# Patient Record
Sex: Male | Born: 1945 | Race: Black or African American | Hispanic: No | Marital: Married | Smoking: Never smoker
Health system: Southern US, Community
[De-identification: ages and names within clinical notes are randomized; demographics above are authoritative.]

## PROBLEM LIST (undated history)

## (undated) DIAGNOSIS — I10 Essential (primary) hypertension: Secondary | ICD-10-CM

## (undated) DIAGNOSIS — M109 Gout, unspecified: Secondary | ICD-10-CM

## (undated) HISTORY — PX: FOOT SURGERY: SHX648

## (undated) HISTORY — PX: KNEE SURGERY: SHX244

## (undated) HISTORY — DX: Essential (primary) hypertension: I10

---

## 1997-08-05 HISTORY — PX: BACK SURGERY: SHX140

## 1999-02-21 ENCOUNTER — Encounter: Payer: Self-pay | Admitting: Anesthesiology

## 1999-02-22 ENCOUNTER — Encounter: Payer: Self-pay | Admitting: Neurosurgery

## 1999-02-22 ENCOUNTER — Observation Stay (HOSPITAL_COMMUNITY): Admission: RE | Admit: 1999-02-22 | Discharge: 1999-02-23 | Payer: Self-pay | Admitting: Neurosurgery

## 1999-02-23 ENCOUNTER — Encounter: Payer: Self-pay | Admitting: Hematology and Oncology

## 1999-04-21 ENCOUNTER — Ambulatory Visit (HOSPITAL_COMMUNITY): Admission: RE | Admit: 1999-04-21 | Discharge: 1999-04-21 | Payer: Self-pay | Admitting: Neurosurgery

## 1999-04-21 ENCOUNTER — Encounter: Payer: Self-pay | Admitting: Neurosurgery

## 1999-05-08 ENCOUNTER — Ambulatory Visit (HOSPITAL_COMMUNITY): Admission: RE | Admit: 1999-05-08 | Discharge: 1999-05-08 | Payer: Self-pay | Admitting: Neurosurgery

## 1999-05-08 ENCOUNTER — Encounter: Payer: Self-pay | Admitting: Neurosurgery

## 1999-05-22 ENCOUNTER — Ambulatory Visit (HOSPITAL_COMMUNITY): Admission: RE | Admit: 1999-05-22 | Discharge: 1999-05-22 | Payer: Self-pay | Admitting: Neurosurgery

## 1999-05-22 ENCOUNTER — Encounter: Payer: Self-pay | Admitting: Neurosurgery

## 1999-06-13 ENCOUNTER — Encounter: Payer: Self-pay | Admitting: Neurosurgery

## 1999-06-13 ENCOUNTER — Ambulatory Visit (HOSPITAL_COMMUNITY): Admission: RE | Admit: 1999-06-13 | Discharge: 1999-06-13 | Payer: Self-pay | Admitting: Neurosurgery

## 2000-01-08 ENCOUNTER — Encounter: Payer: Self-pay | Admitting: Neurosurgery

## 2000-01-08 ENCOUNTER — Ambulatory Visit (HOSPITAL_COMMUNITY): Admission: RE | Admit: 2000-01-08 | Discharge: 2000-01-08 | Payer: Self-pay | Admitting: Neurosurgery

## 2000-01-22 ENCOUNTER — Encounter: Payer: Self-pay | Admitting: Neurosurgery

## 2000-01-22 ENCOUNTER — Ambulatory Visit: Admission: RE | Admit: 2000-01-22 | Discharge: 2000-01-22 | Payer: Self-pay | Admitting: Neurosurgery

## 2000-02-05 ENCOUNTER — Encounter: Payer: Self-pay | Admitting: Neurosurgery

## 2000-02-05 ENCOUNTER — Ambulatory Visit (HOSPITAL_COMMUNITY): Admission: RE | Admit: 2000-02-05 | Discharge: 2000-02-05 | Payer: Self-pay | Admitting: Neurosurgery

## 2000-06-28 ENCOUNTER — Encounter: Payer: Self-pay | Admitting: Neurosurgery

## 2000-06-28 ENCOUNTER — Ambulatory Visit (HOSPITAL_COMMUNITY): Admission: RE | Admit: 2000-06-28 | Discharge: 2000-06-28 | Payer: Self-pay | Admitting: Neurosurgery

## 2000-07-09 ENCOUNTER — Ambulatory Visit (HOSPITAL_COMMUNITY): Admission: RE | Admit: 2000-07-09 | Discharge: 2000-07-09 | Payer: Self-pay | Admitting: Neurosurgery

## 2000-07-09 ENCOUNTER — Encounter: Payer: Self-pay | Admitting: Neurosurgery

## 2000-07-23 ENCOUNTER — Encounter: Payer: Self-pay | Admitting: Neurosurgery

## 2000-07-23 ENCOUNTER — Ambulatory Visit (HOSPITAL_COMMUNITY): Admission: RE | Admit: 2000-07-23 | Discharge: 2000-07-23 | Payer: Self-pay | Admitting: Neurosurgery

## 2000-08-20 ENCOUNTER — Ambulatory Visit (HOSPITAL_COMMUNITY): Admission: RE | Admit: 2000-08-20 | Discharge: 2000-08-20 | Payer: Self-pay | Admitting: Neurosurgery

## 2000-08-20 ENCOUNTER — Encounter: Payer: Self-pay | Admitting: Neurosurgery

## 2001-01-02 ENCOUNTER — Encounter: Payer: Self-pay | Admitting: Neurosurgery

## 2001-01-02 ENCOUNTER — Encounter: Admission: RE | Admit: 2001-01-02 | Discharge: 2001-01-02 | Payer: Self-pay | Admitting: Neurosurgery

## 2001-12-22 ENCOUNTER — Encounter: Payer: Self-pay | Admitting: *Deleted

## 2001-12-22 ENCOUNTER — Emergency Department (HOSPITAL_COMMUNITY): Admission: EM | Admit: 2001-12-22 | Discharge: 2001-12-22 | Payer: Self-pay | Admitting: *Deleted

## 2002-09-01 ENCOUNTER — Ambulatory Visit (HOSPITAL_COMMUNITY): Admission: RE | Admit: 2002-09-01 | Discharge: 2002-09-01 | Payer: Self-pay | Admitting: Neurosurgery

## 2002-09-01 ENCOUNTER — Encounter: Payer: Self-pay | Admitting: Neurosurgery

## 2002-09-16 ENCOUNTER — Encounter: Admission: RE | Admit: 2002-09-16 | Discharge: 2002-09-16 | Payer: Self-pay | Admitting: Neurosurgery

## 2002-09-16 ENCOUNTER — Encounter: Payer: Self-pay | Admitting: Neurosurgery

## 2002-09-29 ENCOUNTER — Encounter: Admission: RE | Admit: 2002-09-29 | Discharge: 2002-09-29 | Payer: Self-pay | Admitting: Neurosurgery

## 2002-09-29 ENCOUNTER — Encounter: Payer: Self-pay | Admitting: Neurosurgery

## 2004-02-15 ENCOUNTER — Encounter: Payer: Self-pay | Admitting: Orthopedic Surgery

## 2004-07-18 ENCOUNTER — Ambulatory Visit: Payer: Self-pay | Admitting: Urgent Care

## 2004-07-19 ENCOUNTER — Ambulatory Visit (HOSPITAL_COMMUNITY): Admission: RE | Admit: 2004-07-19 | Discharge: 2004-07-19 | Payer: Self-pay | Admitting: Internal Medicine

## 2004-07-19 ENCOUNTER — Ambulatory Visit: Payer: Self-pay | Admitting: Internal Medicine

## 2005-10-23 ENCOUNTER — Ambulatory Visit: Payer: Self-pay | Admitting: Orthopedic Surgery

## 2006-01-07 ENCOUNTER — Ambulatory Visit (HOSPITAL_COMMUNITY): Admission: RE | Admit: 2006-01-07 | Discharge: 2006-01-07 | Payer: Self-pay | Admitting: Family Medicine

## 2006-01-15 ENCOUNTER — Ambulatory Visit: Payer: Self-pay | Admitting: Orthopedic Surgery

## 2006-04-24 ENCOUNTER — Ambulatory Visit: Payer: Self-pay | Admitting: Orthopedic Surgery

## 2006-05-02 ENCOUNTER — Ambulatory Visit (HOSPITAL_COMMUNITY): Admission: RE | Admit: 2006-05-02 | Discharge: 2006-05-02 | Payer: Self-pay | Admitting: Urology

## 2006-05-02 ENCOUNTER — Encounter (INDEPENDENT_AMBULATORY_CARE_PROVIDER_SITE_OTHER): Payer: Self-pay | Admitting: Specialist

## 2006-06-24 ENCOUNTER — Ambulatory Visit (HOSPITAL_COMMUNITY): Admission: RE | Admit: 2006-06-24 | Discharge: 2006-06-24 | Payer: Self-pay | Admitting: Urology

## 2006-11-13 ENCOUNTER — Emergency Department (HOSPITAL_COMMUNITY): Admission: EM | Admit: 2006-11-13 | Discharge: 2006-11-13 | Payer: Self-pay | Admitting: Emergency Medicine

## 2006-12-03 ENCOUNTER — Ambulatory Visit: Payer: Self-pay | Admitting: Orthopedic Surgery

## 2007-02-25 ENCOUNTER — Ambulatory Visit: Payer: Self-pay | Admitting: Orthopedic Surgery

## 2007-05-21 ENCOUNTER — Ambulatory Visit: Payer: Self-pay | Admitting: Internal Medicine

## 2007-05-21 ENCOUNTER — Ambulatory Visit: Payer: Self-pay | Admitting: Orthopedic Surgery

## 2007-05-21 DIAGNOSIS — M1710 Unilateral primary osteoarthritis, unspecified knee: Secondary | ICD-10-CM

## 2007-06-17 ENCOUNTER — Ambulatory Visit (HOSPITAL_COMMUNITY): Admission: RE | Admit: 2007-06-17 | Discharge: 2007-06-17 | Payer: Self-pay | Admitting: Internal Medicine

## 2007-06-17 ENCOUNTER — Ambulatory Visit: Payer: Self-pay | Admitting: Internal Medicine

## 2007-06-22 ENCOUNTER — Ambulatory Visit (HOSPITAL_COMMUNITY): Admission: RE | Admit: 2007-06-22 | Discharge: 2007-06-22 | Payer: Self-pay | Admitting: Internal Medicine

## 2007-06-22 ENCOUNTER — Ambulatory Visit: Payer: Self-pay | Admitting: Gastroenterology

## 2007-07-03 ENCOUNTER — Ambulatory Visit (HOSPITAL_COMMUNITY): Admission: RE | Admit: 2007-07-03 | Discharge: 2007-07-03 | Payer: Self-pay | Admitting: Internal Medicine

## 2007-08-06 HISTORY — PX: COLON SURGERY: SHX602

## 2008-05-04 ENCOUNTER — Ambulatory Visit: Payer: Self-pay | Admitting: Orthopedic Surgery

## 2008-05-04 DIAGNOSIS — Z8679 Personal history of other diseases of the circulatory system: Secondary | ICD-10-CM | POA: Insufficient documentation

## 2009-01-11 ENCOUNTER — Encounter: Payer: Self-pay | Admitting: Internal Medicine

## 2009-04-05 ENCOUNTER — Ambulatory Visit: Payer: Self-pay | Admitting: Orthopedic Surgery

## 2009-05-03 ENCOUNTER — Ambulatory Visit: Payer: Self-pay | Admitting: Orthopedic Surgery

## 2009-05-26 ENCOUNTER — Encounter (INDEPENDENT_AMBULATORY_CARE_PROVIDER_SITE_OTHER): Payer: Self-pay | Admitting: *Deleted

## 2009-05-30 ENCOUNTER — Ambulatory Visit: Payer: Self-pay | Admitting: Orthopedic Surgery

## 2009-05-30 ENCOUNTER — Inpatient Hospital Stay (HOSPITAL_COMMUNITY): Admission: RE | Admit: 2009-05-30 | Discharge: 2009-06-03 | Payer: Self-pay | Admitting: Orthopedic Surgery

## 2009-06-01 ENCOUNTER — Encounter (INDEPENDENT_AMBULATORY_CARE_PROVIDER_SITE_OTHER): Payer: Self-pay | Admitting: *Deleted

## 2009-06-01 ENCOUNTER — Encounter: Payer: Self-pay | Admitting: Orthopedic Surgery

## 2009-06-04 ENCOUNTER — Encounter: Payer: Self-pay | Admitting: Orthopedic Surgery

## 2009-06-06 ENCOUNTER — Encounter: Payer: Self-pay | Admitting: Orthopedic Surgery

## 2009-06-08 ENCOUNTER — Encounter: Payer: Self-pay | Admitting: Orthopedic Surgery

## 2009-06-13 ENCOUNTER — Ambulatory Visit: Payer: Self-pay | Admitting: Orthopedic Surgery

## 2009-06-28 ENCOUNTER — Encounter (INDEPENDENT_AMBULATORY_CARE_PROVIDER_SITE_OTHER): Payer: Self-pay | Admitting: *Deleted

## 2009-06-28 ENCOUNTER — Encounter: Payer: Self-pay | Admitting: Orthopedic Surgery

## 2009-07-04 ENCOUNTER — Encounter: Payer: Self-pay | Admitting: Orthopedic Surgery

## 2009-07-05 ENCOUNTER — Encounter (HOSPITAL_COMMUNITY): Admission: RE | Admit: 2009-07-05 | Discharge: 2009-08-04 | Payer: Self-pay | Admitting: Orthopedic Surgery

## 2009-07-05 ENCOUNTER — Encounter: Payer: Self-pay | Admitting: Orthopedic Surgery

## 2009-07-12 ENCOUNTER — Ambulatory Visit: Payer: Self-pay | Admitting: Orthopedic Surgery

## 2009-07-15 IMAGING — RF DG SMALL BOWEL
15 of 24 series · 15 of 24 positions shown · non-contrast
Comparison: none

CLINICAL DATA: Multiple polyps identified with a capsule study. That report is available.  No prior imaging of the small bowel is available for review.
 SMALL BOWEL STUDY ? 07/03/07:
TECHNIQUE: Numerous images of the small bowel are performed throughout the examination with compression in an attempt to evaluate for polyps.

[Series 1: run · 1 of 10 slices shown (1 of 15)]
[im 1/10]
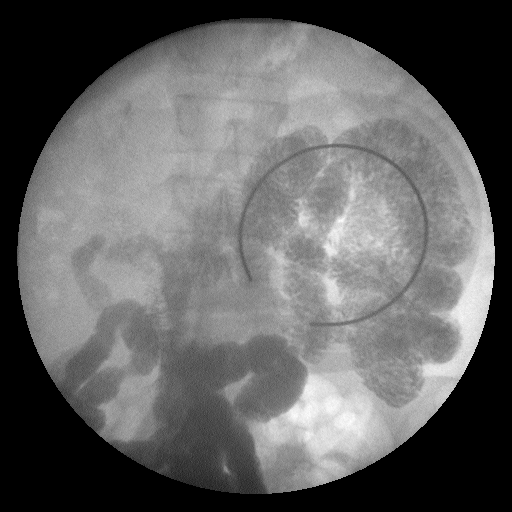

[Series 3: run · 1 of 1 slices shown (2 of 15)]
[im 1/1]
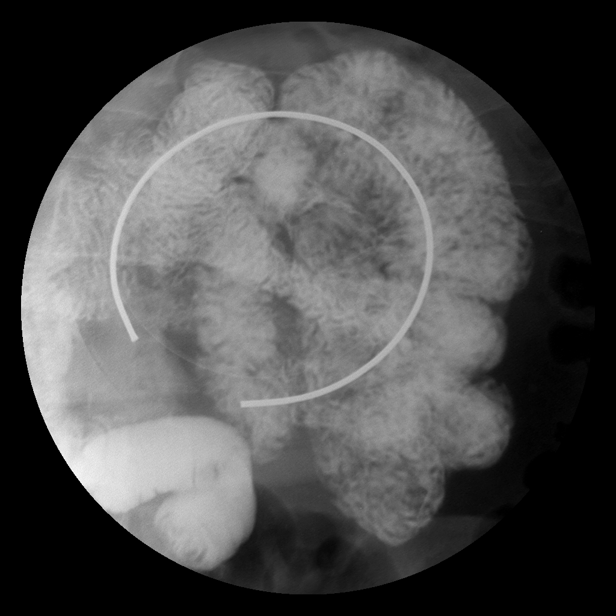

[Series 5: run · 1 of 1 slices shown (3 of 15)]
[im 1/1]
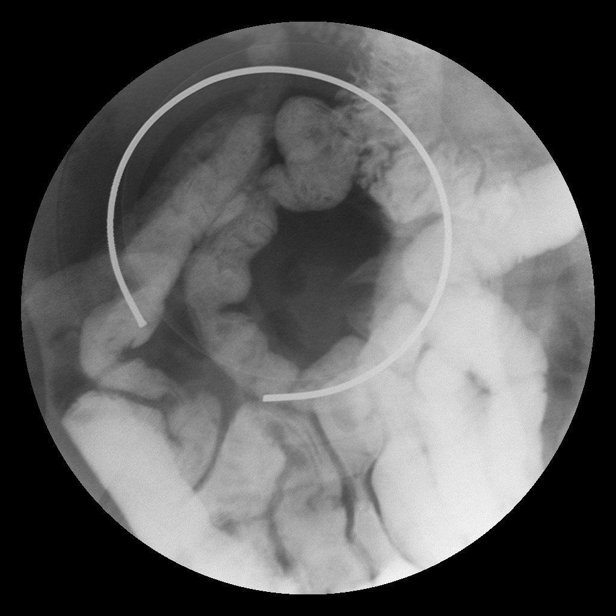

[Series 6: run · 1 of 1 slices shown (4 of 15)]
[im 1/1]
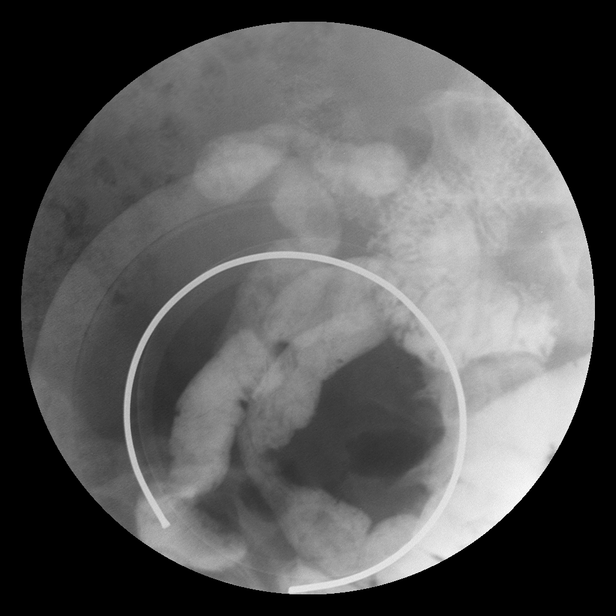

[Series 8: run · 1 of 1 slices shown (5 of 15)]
[im 1/1]
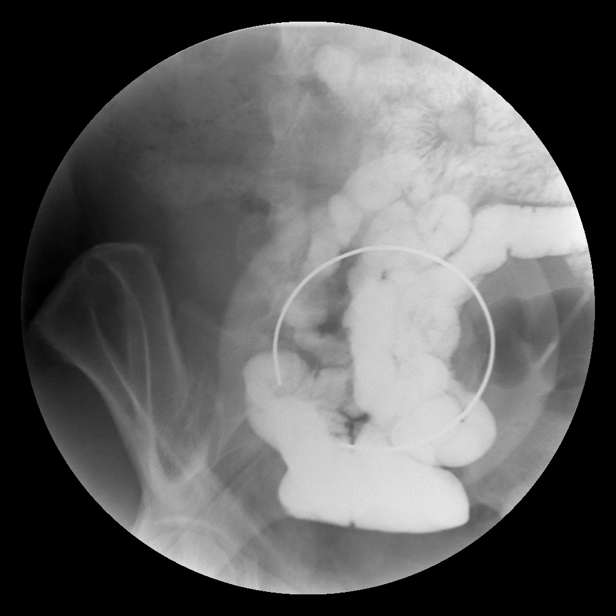

[Series 9: run · 1 of 13 slices shown (6 of 15)]
[im 1/13]
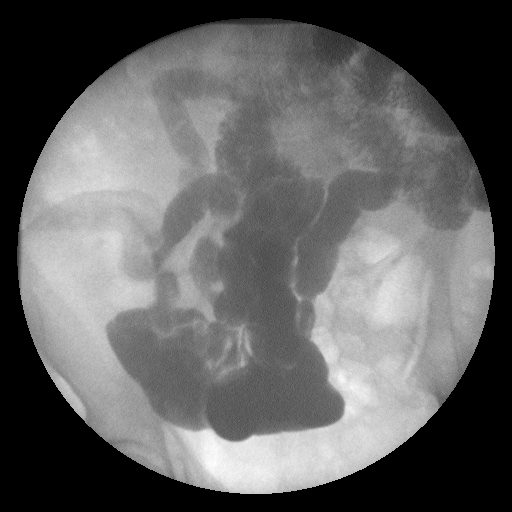

[Series 11: run · 1 of 1 slices shown (7 of 15)]
[im 1/1]
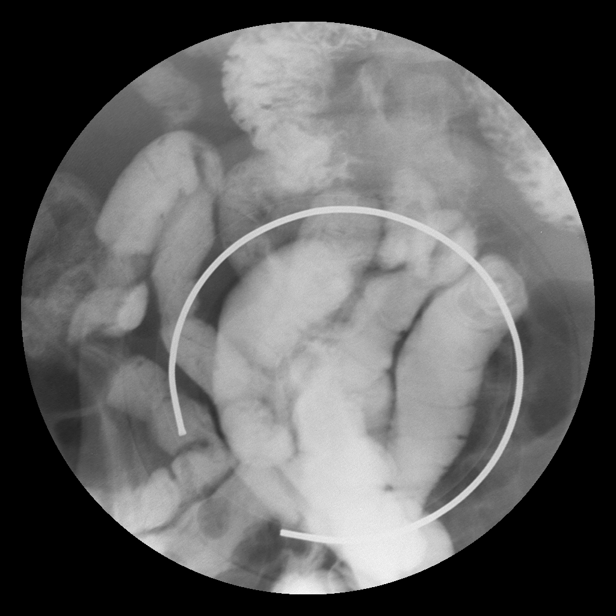

[Series 13: run · 1 of 1 slices shown (8 of 15)]
[im 1/1]
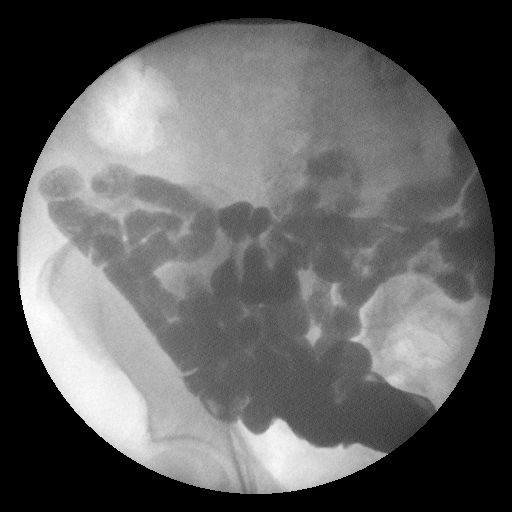

[Series 14: run · 1 of 1 slices shown (9 of 15)]
[im 1/1]
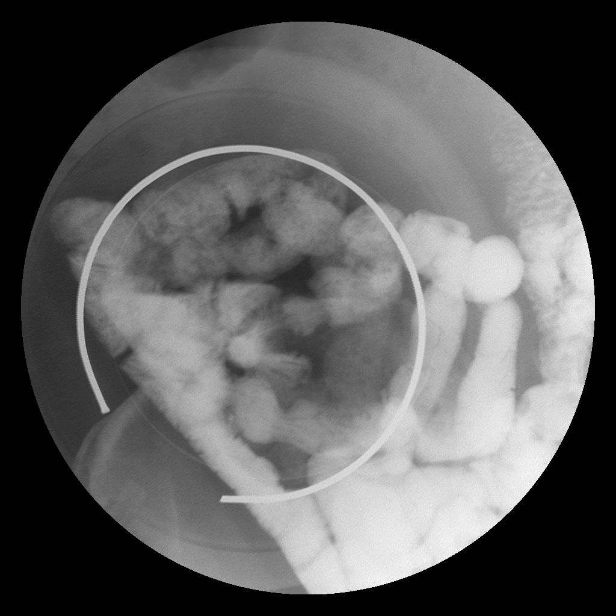

[Series 16: run · 1 of 1 slices shown (10 of 15)]
[im 1/1]
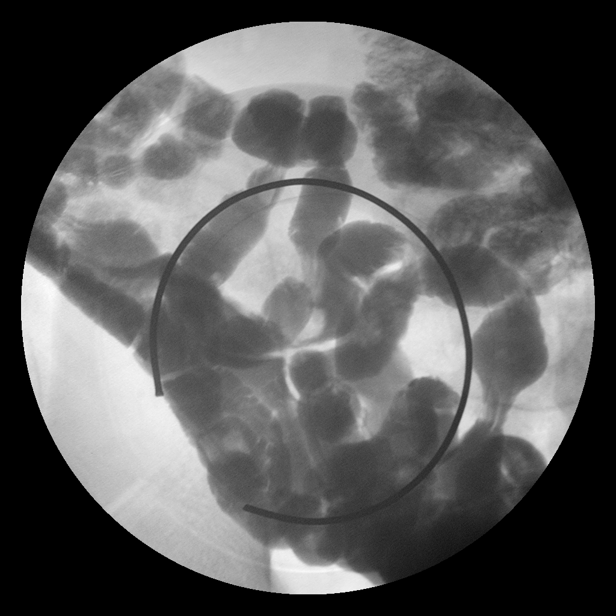

[Series 17: run · 1 of 1 slices shown (11 of 15)]
[im 1/1]
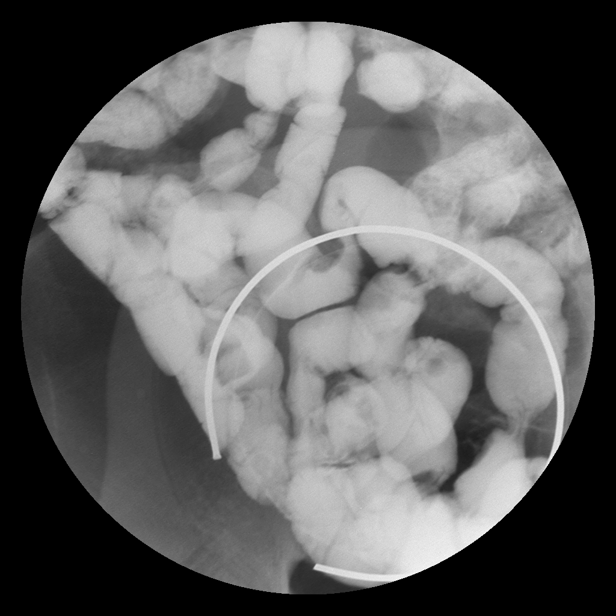

[Series 19: run · 1 of 1 slices shown (12 of 15)]
[im 1/1]
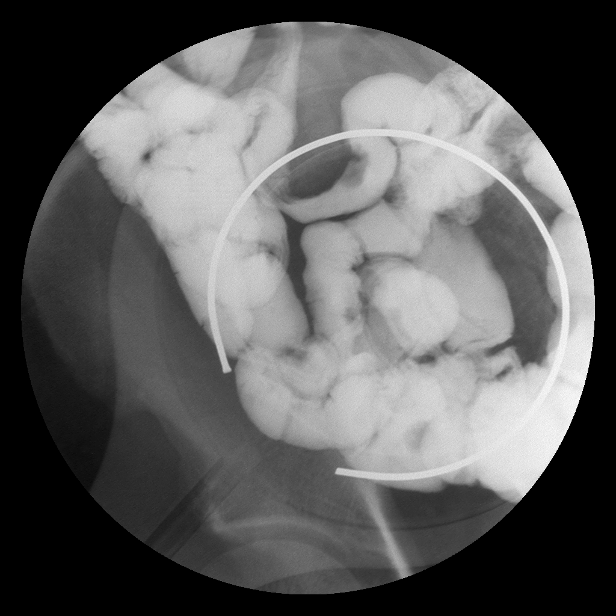

[Series 21: run · 1 of 1 slices shown (13 of 15)]
[im 1/1]
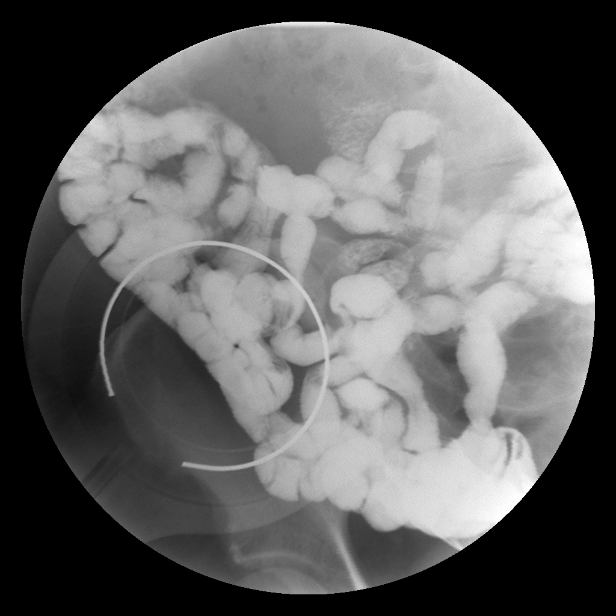

[Series 22: run · 1 of 1 slices shown (14 of 15)]
[im 1/1]
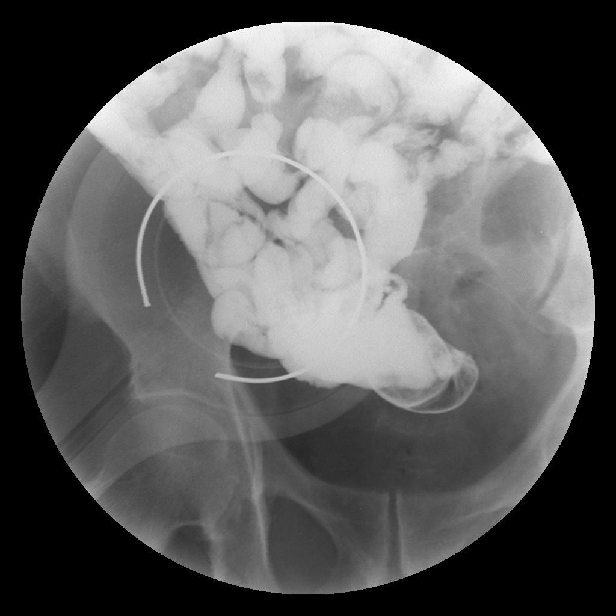

[Series 24: run · 1 of 1 slices shown (15 of 15)]
[im 1/1]
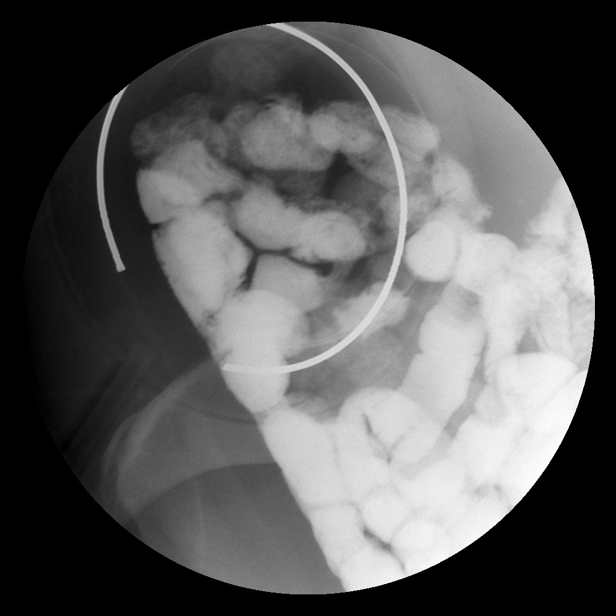

[15 of 24 positions shown; findings below may reference images not displayed]

FINDINGS: One definite persistent filling defect is identified within a small bowel loop in the right lower quadrant.  This is a polypoid, somewhat broad-based appearing filling defect.  Although exact measurements are difficult at fluoroscopy, it is estimated to be roughly 2 cm in size.  This mass does not cause obstruction.  The abnormality is probably best seen on series #18.
 The entire small bowel was evaluated to the level of the terminal ileum and at the end of the examination contrast was seen within the colon.
IMPRESSION: One definite persistent filling defect identified within a small bowel loop in the right lower quadrant and worrisome for a malignant versus benign polyp.

## 2009-07-24 ENCOUNTER — Encounter: Payer: Self-pay | Admitting: Orthopedic Surgery

## 2009-08-02 ENCOUNTER — Encounter: Payer: Self-pay | Admitting: Orthopedic Surgery

## 2009-08-09 ENCOUNTER — Encounter (HOSPITAL_COMMUNITY): Admission: RE | Admit: 2009-08-09 | Discharge: 2009-09-08 | Payer: Self-pay | Admitting: Orthopedic Surgery

## 2009-08-13 ENCOUNTER — Emergency Department (HOSPITAL_COMMUNITY): Admission: EM | Admit: 2009-08-13 | Discharge: 2009-08-13 | Payer: Self-pay | Admitting: Emergency Medicine

## 2009-08-23 ENCOUNTER — Ambulatory Visit: Payer: Self-pay | Admitting: Orthopedic Surgery

## 2009-10-04 ENCOUNTER — Ambulatory Visit: Payer: Self-pay | Admitting: Orthopedic Surgery

## 2009-10-16 ENCOUNTER — Telehealth: Payer: Self-pay | Admitting: Orthopedic Surgery

## 2009-11-22 ENCOUNTER — Ambulatory Visit: Payer: Self-pay | Admitting: Orthopedic Surgery

## 2010-02-07 ENCOUNTER — Ambulatory Visit: Payer: Self-pay | Admitting: Orthopedic Surgery

## 2010-02-07 DIAGNOSIS — M25469 Effusion, unspecified knee: Secondary | ICD-10-CM

## 2010-03-12 ENCOUNTER — Ambulatory Visit: Payer: Self-pay | Admitting: Orthopedic Surgery

## 2010-05-31 ENCOUNTER — Ambulatory Visit: Payer: Self-pay | Admitting: Orthopedic Surgery

## 2010-07-11 ENCOUNTER — Ambulatory Visit: Payer: Self-pay | Admitting: Orthopedic Surgery

## 2010-08-05 HISTORY — PX: JOINT REPLACEMENT: SHX530

## 2010-09-04 NOTE — Miscellaneous (Signed)
Summary: [facility] PT progress note  [facility] PT progress note   Imported By: Jacklynn Ganong 08/21/2009 13:45:26  _____________________________________________________________________  External Attachment:    Type:   Image     Comment:   External Document

## 2010-09-04 NOTE — Assessment & Plan Note (Signed)
Summary: fluid on knee/wants drawn off/frs   Visit Type:  Follow-up  CC:  fluid on knee.  History of Present Illness: I saw [facility] in the office today for a followup visit.  He is a 65 years old man with the complaint of:  fluid left knee for a few weeks.  65 year old male status post RIGHT total knee arthroplasty which is doing well presents with complaint of swelling and pain in his LEFT knee in request his fluid the trauma.  Associated signs and symptoms include stiffness of the LEFT knee  what we find his an antalgic gait which favors the LEFT knee mediolateral joint line tenderness with large palpable effusion, range of motion is limited to 100 because of swelling.  She is normal muscle tone excellent  Next able.  We injected the LEFT knee after drying out approximately 45 cc of clear yellow fluid  Recommend ice,  rest, followup one month;  patient may schedule appointment to have surgery with replacement LEFT knee    Allergies: No Known Drug Allergies   Impression & Recommendations:  Problem # 1:  JOINT EFFUSION, KNEE (ICD-719.06) Assessment Deteriorated  Verbal consent was obtained. The knee was prepped with alcohol and ethyl chloride. 1 cc of depomedrol 40mg /cc and 4 cc of lidocaine 1% was injected. there were no complications. injection was done after aspiration  Orders: Est. Patient Level III (16109) Joint Aspirate / Injection, Large (20610) Depo- Medrol 40mg  (J1030)  Patient Instructions: 1)  You have received an injection of cortisone today. You may experience increased pain at the injection site. Apply ice pack to the area for 20 minutes every 2 hours and take 2 xtra strength tylenol every 8 hours. This increased pain will usually resolve in 24 hours. The injection will take effect in 3-10 days.  2)  Please schedule a follow-up appointment in 1 month.

## 2010-09-04 NOTE — Assessment & Plan Note (Signed)
Summary: LT KNEE PAIN+SWELLING/NEED XRAY/BCBS/CAF   Visit Type:  Follow-up  CC:  left knee pain.  History of Present Illness: I saw [facility] in the office today for a followup visit.  He is a 65 years old man with the complaint of:  left knee  Xrays today.  Devin Callahan complains of pain and swelling in the LEFT knee.  He is status post RIGHT total knee replacement doing well.  [facility] and doing very well with his RIGHT knee but his LEFT knee started to swell hurt and then he started losing motion he presents for evaluation  Allergies: No Known Drug Allergies  Past History:  Past Medical History: Last updated: 04/05/2009 High Blood Pressure cholesterol  Past Surgical History: Last updated: 05/04/2008 Foot surgery Facial Back surgery 1999  colon surgery 2009   Family History: Last updated: 05/04/2008 Family History of Arthritis Hx, family, asthma  Social History: Last updated: 05/04/2008 Patient is married.   Review of Systems Neurologic:  Denies numbness and tingling. Musculoskeletal:  Complains of swelling and stiffness.  Physical Exam  Skin:  intact without lesions or rashes Psych:  alert and cooperative; normal mood and affect; normal attention span and concentration   Knee Exam  General:    Well-developed, well-nourished, normal body habitus; no deformities, normal grooming.  Gait:    limp noted-left.    Skin:    scar-R: healed normally  Vascular:    There was no swelling or varicose veins.   Sensory:    Gross coordination and sensation were normal.    Motor:    Motor strength 5/5 bilaterally for quadriceps, hamstrings, ankle dorsiflexion, and ankle plantar flexion.    Knee Exam:    Right:    Inspection:  Normal    Palpation:  Normal    Stability:  stable    Tenderness:  no    Swelling:  no    Range of Motion:       Flexion-Active: 115 degrees    Left:    Inspection:  Abnormal    Palpation:   Abnormal    Stability:  stable    Tenderness:  tenderness medial and lateral joint line but more medial than lateral    Range of Motion:       Flexion-Active: 120 degrees   Impression & Recommendations:  Problem # 1:  JOINT EFFUSION, KNEE (ICD-719.06)  3 views LEFT knee  There is complete loss of joint space of the medial compartment with a moderate amount of varus deformity, there is surrounding osteophytes as well as joint effusion  Impression severe osteoarthritis  As we talked the patient would like to have surgery on the LEFT knee in March with a total knee replacement but he would like to try to put it off until then.  He would like an aspiration and injection LEFT knee  We aspirated the LEFT knee under sterile conditions and got off about 40 cc of clear yellow fluid   Verbal consent was obtained. The RIGHT knee was prepped with alcohol and ethyl chloride. 1 cc of depomedrol 40mg /cc and 4 cc of lidocaine 1% was injected. there were no complications.  Orders: Est. Patient Level IV (62952) Knee x-ray,  3 views (84132) Joint Aspirate / Injection, Large (20610) Depo- Medrol 40mg  (J1030)  Problem # 2:  OSTEOARTHRITIS, LOWER LEG (ICD-715.16)  Orders: Est. Patient Level IV (44010) Knee x-ray,  3 views (27253) Joint Aspirate / Injection, Large (66440) Depo-  Medrol 40mg  (J1030)  Patient Instructions: 1)  You have received an injection of cortisone today. You may experience increased pain at the injection site. Apply ice pack to the area for 20 minutes every 2 hours and take 2 xtra strength tylenol every 8 hours. This increased pain will usually resolve in 24 hours. The injection will take effect in 3-10 days.  2)  Return in Feb to schedule surgery for March    Orders Added: 1)  Est. Patient Level IV [62952] 2)  Knee x-ray,  3 views [73562] 3)  Joint Aspirate / Injection, Large [20610] 4)  Depo- Medrol 40mg  [J1030]

## 2010-09-04 NOTE — Letter (Signed)
Summary: Surg order RT total knee  Surg order RT total knee   Imported By: Cammie Sickle 08/08/2009 18:43:50  _____________________________________________________________________  External Attachment:    Type:   Image     Comment:   External Document

## 2010-09-04 NOTE — Assessment & Plan Note (Signed)
Summary: rt knee swelling/surg 05/30/09/bcbs/bsf   Visit Type:  Follow-up  CC:  right knee pain.  History of Present Illness: I saw [facility] in the office today for a followup visit.  He is a 65 years old man with the complaint of:  right knee.  DOS 05/30/09 right total knee, [employer]  Medication none  He states that his knee has been swelling after he has been upon his knee.  He also says he hears a click at times.  Examination he has full extension of the RIGHT knee. His incision looks good. No tenderness. No joint swelling today, but he has been up on it except to come to the office.  He stability in extension, flexion, and with collateral ligament stress testing.  He has full extension, power, on straight leg raise.  X-rays ordered 3 views RIGHT total knee.  the wound and the patella is well reduced. The alignment is normal. There is no radiolucency. No complicating feature seen on the knee replacement. Impression normal postop film.  Recommend the patient use ice when the knee swells. This should dissipate over time. He will keep his followup appointment for April 26 or thereabouts.    Allergies: No Known Drug Allergies  Review of Systems Musculoskeletal:  Complains of joint pain; left knee pain .   Impression & Recommendations:  Problem # 1:  AFTERCARE FOLLOW SURGERY MUSCULOSKEL SYSTEM NEC (ICD-V58.78) Assessment Comment Only  Orders: Est. Patient Level III (16109) Knee x-ray,  3 views (60454)  Problem # 2:  OSTEOARTHRITIS, LOWER LEG (ICD-715.16) Assessment: Comment Only  Orders: Est. Patient Level III (09811) Knee x-ray,  3 views (91478)  Patient Instructions: 1)  f/u in April for 6 month check up

## 2010-09-04 NOTE — Miscellaneous (Signed)
Summary: [facility] [place]   Imported By: Cammie Sickle 08/08/2009 18:40:05  _____________________________________________________________________  External Attachment:    Type:   Image     Comment:   External Document

## 2010-09-04 NOTE — Assessment & Plan Note (Signed)
Summary: 6 WK RE-CK/XRAYS/TKA/SURG 05/30/09/BCBS/CAF   Visit Type:  post op   CC:  none .  History of Present Illness: I saw [facility] in the office today for a followup visit.  He is a 65 years old man with the complaint of:    DOS 05/30/09 right total knee, [employer]  Medication Percocet 5  as needed only.  Subjectives 6 week recheck with xrays right TKA, check incision.  Doing good.  No problems.  Discharged from [facility] 08/22/09.  Pulled muscle in lower back was given muscle relaxer from er 08/13/09, better.  PHYSICAL EXAM  no knee swelling, good knee flexion and he has full extension   Assessment:   Post TKA, doing well   right knee 3 views  The  alignment was normal, PTF reduced without tilt or subluxation, no evidence of loosening.  IMPRESSION: normal appearance of the implant           Allergies: No Known Drug Allergies   Impression & Recommendations:  Problem # 1:  AFTERCARE FOLLOW SURGERY MUSCULOSKEL SYSTEM NEC (ICD-V58.78) Assessment Improved  Orders: Post-Op Check (16109) Knee x-ray,  3 views (60454)  Problem # 2:  OSTEOARTHRITIS, LOWER LEG (ICD-715.16) Assessment: Improved  Orders: Post-Op Check (09811) Knee x-ray,  3 views (91478)  Patient Instructions: 1)  come back in 3 months  2)  for xrays of the TKA - Right

## 2010-09-04 NOTE — Miscellaneous (Signed)
Summary: PT prog note  PT prog note   Imported By: Cammie Sickle 08/08/2009 18:45:49  _____________________________________________________________________  External Attachment:    Type:   Image     Comment:   External Document

## 2010-09-04 NOTE — Assessment & Plan Note (Signed)
Summary: 3 M RE-CK/XRAYS RT KNEE/SURG 05/30/09/BCBS/CAF   Visit Type:  Follow-up  CC:  right knee replacement..  History of Present Illness: Devin Callahan he is 6 months after knee replacementin October of 2010  Complaint of LEFT knee swelling and inability to bend the knee fully  RIGHT knee replacement doing well at this time  Review of systems negative for musculoskeletal problems other than stated  Exam shows good shrink in his RIGHT knee extension stable knee hip flexion  LEFT knee tender joint effusion, 110 flexion, full extension, strength grade 5  I aspirated his LEFT 20 cc of clear yellow fluid injected cortisone  Recommend 6 month followup for RIGHT knee x-ray or sooner with a new phone call if needed for his LEFT knee    Allergies: No Known Drug Allergies   Impression & Recommendations:  Problem # 1:  OSTEOARTHRITIS, LOWER LEG (ICD-715.16) Assessment Deteriorated LEFT knee Orders: Est. Patient Level III (16109) Depo- Medrol 40mg  (J1030) Joint Aspirate / Injection, Large (20610)/LEFT knee Verbal consent was obtained. The knee was prepped with alcohol and ethyl chloride. 1 cc of depomedrol 40mg /cc and 4 cc of lidocaine 1% was injected. there were no complications.  Patient Instructions: 1)  You have received an injection of cortisone today. You may experience increased pain at the injection site. Apply ice pack to the area for 20 minutes every 2 hours and take 2 xtra strength tylenol every 8 hours. This increased pain will usually resolve in 24 hours. The injection will take effect in 3-10 days.  2)  F/U OCT 1 year f/u xrays for rt TKA

## 2010-09-04 NOTE — Progress Notes (Signed)
Summary: how long to premedicate befor dental work?  Phone Note Other Incoming   Summary of Call: Received a call this morning from [facility] 's (2045-09-20) dentist.  Verlon Au , at Dr.Johnson's ofice said Mr. Devin Callahan was there this morning for an appointment and they gave him the pre meds for  today.  She wanted to know how long after his knee replacement do you want him to premedicate before dental work.   Leslie's # is [id number] Initial call taken by: Jacklynn Ganong,  October 16, 2009 9:10 AM  Follow-up for Phone Call        forever Follow-up by: Fuller Canada MD,  October 16, 2009 9:46 AM  Additional Follow-up for Phone Call Additional follow up Details #1::        Rhetta Mura at dental office of reply Additional Follow-up by: Jacklynn Ganong,  October 16, 2009 10:45 AM

## 2010-09-04 NOTE — Miscellaneous (Signed)
Summary: PT Progress note  PT Progress note   Imported By: Jacklynn Ganong 08/11/2009 07:50:13  _____________________________________________________________________  External Attachment:    Type:   Image     Comment:   External Document

## 2010-09-04 NOTE — Assessment & Plan Note (Signed)
Summary: YEARLY RE-CK/XRAYS TKA/BCBS/CAF   Visit Type:  Follow-up  CC:  recheck TKA.  History of Present Illness: This is a 65 year old male who is here for his one-year followup status post RIGHT total knee arthroplasty on October 26 of last year with a [employer] implant  The patient says it is not need any pain medication for his knee at this time.  He says is doing very well.  He still has some pain in his LEFT knee status post injection but he says is not ready for surgery.  Review of systems as stated with no other new complaints  Examination of his RIGHT knee shows that he has really gained significant flexion in his knee with 120, he has full extension.  No deformity.  He has excellent extension power.  His incision is excellent.  He has normal sensation in the knee except for small area laterally.  The knee is rock solid stable and the anteroposterior position as well as the medial lateral position.  He has good perfusion to the limb.  The RIGHT knee was x-rayed The  alignment was normal, PTF reduced without tilt or subluxation, no evidence of loosening.  IMPRESSION: normal appearance of the implant    Assessment he is doing very well regarding his RIGHT knee replacement.  He has some pain in his LEFT knee which will require surgery at some point.  We should x-ray that approximately 6 months, and his RIGHT knee x-rays should be done at an annual followup one year  Allergies (verified): No Known Drug Allergies   Impression & Recommendations:  Problem # 1:  AFTERCARE FOLLOW SURGERY MUSCULOSKEL SYSTEM NEC (ICD-V58.78) Assessment Improved  Orders: Est. Patient Level III (04540) Knee x-ray,  3 views (98119)  Problem # 2:  OSTEOARTHRITIS, LOWER LEG (ICD-715.16) Assessment: Improved  Orders: Est. Patient Level III (14782) Knee x-ray,  3 views (95621)  Patient Instructions: 1)  Please schedule a follow-up appointment in 1 year. next xrays right knee  2)  6 months  f/u x-rays left knee    Orders Added: 1)  Est. Patient Level III [30865] 2)  Knee x-ray,  3 views [73562]

## 2010-09-04 NOTE — Miscellaneous (Signed)
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

## 2010-09-04 NOTE — Assessment & Plan Note (Signed)
Summary: 1 M RE-CK KNEE FOL'G INJEC/BCBS/CAF   Visit Type:  Follow-up  CC:  left knee pain.  History of Present Illness: I saw [facility] in the office today for a 1 month followup visit.  He is a 65 years old man with the complaint of:  left knee  DOS 05/30/09 right total knee, [employer]  Injection and aspiration last visit in left knee. He states that the injection helped, he still has some stiffness.  He seems to be much better now in terms of pain in his LEFT knee he wants to wait on any surgery and I agree   There is no knee effusion he has free range of motion, good strength.  No instability.  No joint effusion.  Minimal tenderness.  His ambulation is improved.  His skin is intact, his pulses are good  Allergies: No Known Drug Allergies  Review of Systems       musculoskeletal/he says his RIGHT knee has a little twinge of pain in every now and then when he is walking but he says his knee feels good   Impression & Recommendations:  Problem # 1:  JOINT EFFUSION, KNEE (ICD-719.06) Assessment Improved  Orders: Est. Patient Level III (16109)  Patient Instructions: 1)  f/u in OCT for TKA xrays of the right knee

## 2010-09-11 ENCOUNTER — Ambulatory Visit (INDEPENDENT_AMBULATORY_CARE_PROVIDER_SITE_OTHER): Payer: BC Managed Care – PPO | Admitting: Orthopedic Surgery

## 2010-09-11 ENCOUNTER — Encounter: Payer: Self-pay | Admitting: Orthopedic Surgery

## 2010-09-11 DIAGNOSIS — M1710 Unilateral primary osteoarthritis, unspecified knee: Secondary | ICD-10-CM

## 2010-09-11 DIAGNOSIS — M25469 Effusion, unspecified knee: Secondary | ICD-10-CM

## 2010-09-12 ENCOUNTER — Encounter (INDEPENDENT_AMBULATORY_CARE_PROVIDER_SITE_OTHER): Payer: Self-pay | Admitting: *Deleted

## 2010-09-20 NOTE — Assessment & Plan Note (Signed)
Summary: return to schedule surgery for march   Visit Type:  Follow-up  CC:  left knee pain.  History of Present Illness: I saw Devin Callahan in the office today for a followup visit.  He is a 65 years old man with the complaint of:  left knee  No pain med.  Devin Callahan is 65 years old. Status post RIGHT total knee arthroplasty presents now with LEFT knee pain, which she's had for some time. We have treated this with serial aspirations and injection of Depo-Medrol, but he has failed treatment with this and he is ready for replacement surgery on his LEFT knee.  He has pain, loss of function, stiffness and swelling, which has been frequent and recurrent.    Allergies: No Known Drug Allergies  Past History:  Past Medical History: Last updated: 04/05/2009 High Blood Pressure cholesterol  Past Surgical History: Last updated: 05/04/2008 Foot surgery Facial Back surgery 1999  colon surgery 2009   Family History: Last updated: 05/04/2008 Family History of Arthritis Hx, family, asthma  Social History: Last updated: 05/04/2008 Patient is married.   Family History: Reviewed history from 05/04/2008 and no changes required. Family History of Arthritis Hx, family, asthma  Social History: Reviewed history from 05/04/2008 and no changes required. Patient is married.   Review of Systems Musculoskeletal:  See HPI.  The review of systems is negative for Constitutional, Cardiovascular, Respiratory, Gastrointestinal, Genitourinary, Neurologic, Endocrine, Psychiatric, Skin, HEENT, Immunology, and Hemoatologic.  Physical Exam  General:  Well developed, well nourished, normal body habitus; no deformities, normal grooming. Lungs:  clear bilaterally to A & P Heart:  regular rate and rhythm, S1, S2 without murmurs, rubs, gallops, or clicks Abdomen:  bowel sounds positive; abdomen soft and non-tender without masses, organomegaly, or hernias noted   Knee Exam  Sensory:    Gross  coordination and sensation were normal.    Motor:    Motor strength 5/5 bilaterally for quadriceps, hamstrings, ankle dorsiflexion, and ankle plantar flexion.    Reflexes:    Normal and symmetric patellar and Achilles reflexes bilaterally.    Knee Exam:    Right:    Inspection/Palpation:  120 rom  STABLE JOINT NO TENDERNESS AND NO SWELLING     Left:    Inspection/Palpation:  JOINT EFFUSION  115 ROM  TENDER MEDIAL JOINT LINE  STABLE ANT-POST     The upper extremities have normal appearance, ROM, strength and stability.    Impression & Recommendations:  Problem # 1:  OSTEOARTHRITIS, LOWER LEG (ICD-715.16)  x-rays were done in December, and they show varus osteoarthritis with mild deformity  Plan for LEFT total knee arthroplasty with the [place] system  [facility]  postop rehabilitation at home  Orders: Est. Patient Level IV (16109) Joint Aspirate / Injection, Large (20610) Depo- Medrol 40mg  (J1030)  Patient Instructions: 1)  You have received an injection of cortisone today. You may experience increased pain at the injection site. Apply ice pack to the area for 20 minutes every 2 hours and take 2 xtra strength tylenol every 8 hours. This increased pain will usually resolve in 24 hours. The injection will take effect in 3-10 days.  2)  surgery scheduled for left TKA  3)  10/29/10   Orders Added: 1)  Est. Patient Level IV [60454] 2)  Joint Aspirate / Injection, Large [20610] 3)  Depo- Medrol 40mg  [J1030]

## 2010-09-20 NOTE — Miscellaneous (Signed)
Summary: faxed [facility]  Clinical Lists Changes

## 2010-09-26 NOTE — Letter (Signed)
Summary: surgery order LT total knee sched 10/15/10  surgery order LT total knee sched 10/15/10   Imported By: Cammie Sickle 09/19/2010 20:21:30  _____________________________________________________________________  External Attachment:    Type:   Image     Comment:   External Document

## 2010-10-05 ENCOUNTER — Encounter (INDEPENDENT_AMBULATORY_CARE_PROVIDER_SITE_OTHER): Payer: Self-pay | Admitting: *Deleted

## 2010-10-11 ENCOUNTER — Other Ambulatory Visit: Payer: Self-pay | Admitting: Orthopedic Surgery

## 2010-10-11 ENCOUNTER — Encounter (HOSPITAL_COMMUNITY): Payer: BC Managed Care – PPO

## 2010-10-11 ENCOUNTER — Other Ambulatory Visit: Payer: Self-pay | Admitting: Infectious Diseases

## 2010-10-11 LAB — BASIC METABOLIC PANEL
BUN: 11 mg/dL (ref 6–23)
Chloride: 101 mEq/L (ref 96–112)
GFR calc non Af Amer: >60 mL/min (ref >60)
Potassium: 3.5 mEq/L (ref 3.5–5.1)
Sodium: 140 mEq/L (ref 135–145)

## 2010-10-11 LAB — DIFFERENTIAL
Basophils Relative: 0 % (ref 0–1)
Lymphs Abs: 1.9 10*3/uL (ref 0.7–4.0)
Monocytes Absolute: 0.7 10*3/uL (ref 0.1–1.0)

## 2010-10-11 LAB — PROTIME-INR
INR: 0.94 (ref 0.00–1.49)
Prothrombin Time: 12.8 seconds (ref 11.6–15.2)

## 2010-10-11 LAB — SURGICAL PCR SCREEN
MRSA, PCR: NEGATIVE
Staphylococcus aureus: NEGATIVE

## 2010-10-11 LAB — CBC
MCHC: 34.3 g/dL (ref 30.0–36.0)
MCV: 85.7 fL (ref 78.0–100.0)
RDW: 14.3 % (ref 11.5–15.5)

## 2010-10-11 NOTE — Miscellaneous (Signed)
Summary: Pre-authorization information in-patient surgery  Clinical Lists Changes  Contacted insurer [employer] ph [phone] re: in-patient surgery scheduled 10/15/10 at [facility];CPT [id number], ICD9 [phone]. Spoke w/Nicole. Surgery approved for  3-day stay,  3/12-3/14/2012, Pre-authorization # [id number].

## 2010-10-15 ENCOUNTER — Ambulatory Visit (HOSPITAL_COMMUNITY): Payer: BC Managed Care – PPO

## 2010-10-15 ENCOUNTER — Inpatient Hospital Stay (HOSPITAL_COMMUNITY)
Admission: RE | Admit: 2010-10-15 | Discharge: 2010-10-18 | DRG: 209 | Disposition: A | Discharge status: Home Health | Payer: BC Managed Care – PPO | Source: Ambulatory Visit | Attending: Orthopedic Surgery | Admitting: Orthopedic Surgery

## 2010-10-15 ENCOUNTER — Encounter: Payer: Self-pay | Admitting: Orthopedic Surgery

## 2010-10-15 DIAGNOSIS — Z01812 Encounter for preprocedural laboratory examination: Secondary | ICD-10-CM

## 2010-10-15 DIAGNOSIS — M1710 Unilateral primary osteoarthritis, unspecified knee: Secondary | ICD-10-CM

## 2010-10-15 DIAGNOSIS — Z96659 Presence of unspecified artificial knee joint: Secondary | ICD-10-CM

## 2010-10-15 DIAGNOSIS — IMO0002 Reserved for concepts with insufficient information to code with codable children: Principal | ICD-10-CM | POA: Diagnosis present

## 2010-10-15 DIAGNOSIS — I10 Essential (primary) hypertension: Secondary | ICD-10-CM | POA: Diagnosis present

## 2010-10-16 LAB — DIFFERENTIAL
Basophils Absolute: 0 10*3/uL (ref 0.0–0.1)
Eosinophils Absolute: 0.5 10*3/uL (ref 0.0–0.7)
Eosinophils Relative: 5 % (ref 0–5)
Lymphs Abs: 1.9 10*3/uL (ref 0.7–4.0)
Monocytes Absolute: 0.9 10*3/uL (ref 0.1–1.0)
Neutro Abs: 7 10*3/uL (ref 1.7–7.7)

## 2010-10-16 LAB — BASIC METABOLIC PANEL
Calcium: 8.5 mg/dL (ref 8.4–10.5)
Creatinine, Ser: 0.76 mg/dL (ref 0.4–1.5)
GFR calc Af Amer: >60 mL/min (ref >60)
Potassium: 3 mEq/L — ABNORMAL LOW (ref 3.5–5.1)
Sodium: 139 mEq/L (ref 135–145)

## 2010-10-16 LAB — CBC
HCT: 34.6 % — ABNORMAL LOW (ref 39.0–52.0)
Hemoglobin: 11.5 g/dL — ABNORMAL LOW (ref 13.0–17.0)
Platelets: 189 10*3/uL (ref 150–400)
RBC: 4.01 MIL/uL — ABNORMAL LOW (ref 4.22–5.81)

## 2010-10-17 LAB — DIFFERENTIAL
Basophils Absolute: 0 10*3/uL (ref 0.0–0.1)
Eosinophils Absolute: 0.7 10*3/uL (ref 0.0–0.7)
Lymphocytes Relative: 15 % (ref 12–46)
Lymphs Abs: 1.6 10*3/uL (ref 0.7–4.0)

## 2010-10-17 LAB — BASIC METABOLIC PANEL
CO2: 31 mEq/L (ref 19–32)
Creatinine, Ser: 0.91 mg/dL (ref 0.4–1.5)
GFR calc Af Amer: >60 mL/min (ref >60)
Potassium: 3.5 mEq/L (ref 3.5–5.1)
Sodium: 137 mEq/L (ref 135–145)

## 2010-10-17 LAB — CBC
HCT: 34.8 % — ABNORMAL LOW (ref 39.0–52.0)
Hemoglobin: 11.6 g/dL — ABNORMAL LOW (ref 13.0–17.0)
MCH: 28.9 pg (ref 26.0–34.0)
Platelets: 190 10*3/uL (ref 150–400)
RBC: 4.02 MIL/uL — ABNORMAL LOW (ref 4.22–5.81)
RDW: 14.4 % (ref 11.5–15.5)

## 2010-10-18 LAB — CROSSMATCH
Antibody Screen: NEGATIVE
Unit division: 0
Unit division: 0

## 2010-10-18 LAB — CBC
MCH: 28.8 pg (ref 26.0–34.0)
MCHC: 34.4 g/dL (ref 30.0–36.0)
MCV: 83.7 fL (ref 78.0–100.0)

## 2010-10-18 LAB — DIFFERENTIAL
Eosinophils Absolute: 0.6 10*3/uL (ref 0.0–0.7)
Monocytes Absolute: 1.1 10*3/uL — ABNORMAL HIGH (ref 0.1–1.0)
Neutro Abs: 7.7 10*3/uL (ref 1.7–7.7)

## 2010-10-18 LAB — BASIC METABOLIC PANEL
Chloride: 103 mEq/L (ref 96–112)
Creatinine, Ser: 0.79 mg/dL (ref 0.4–1.5)
GFR calc Af Amer: >60 mL/min (ref >60)

## 2010-10-19 ENCOUNTER — Encounter: Payer: Self-pay | Admitting: Orthopedic Surgery

## 2010-10-23 HISTORY — PX: KNEE SURGERY: SHX244

## 2010-10-23 NOTE — Letter (Signed)
Summary: [facility] medication discharge instructions  [facility] medication discharge instructions   Imported By: Jacklynn Ganong 10/19/2010 11:33:40  _____________________________________________________________________  External Attachment:    Type:   Image     Comment:   External Document

## 2010-10-24 ENCOUNTER — Encounter: Payer: Self-pay | Admitting: Orthopedic Surgery

## 2010-10-26 NOTE — H&P (Addendum)
  NAME:  Devin Callahan, MATHENIA NO.:  [id number]  MEDICAL RECORD NO.:  [id number]           PATIENT TYPE:  LOCATION:                                FACILITY:  [facility]  PHYSICIAN:  Vickki Hearing, M.D.DATE OF BIRTH:  [birth year]  DATE OF ADMISSION:  10/11/2010 DATE OF DISCHARGE:  LH                             HISTORY & PHYSICAL   CHIEF COMPLAINT:  Left knee pain.  HISTORY:  Mong is 65 year old.  He had a right total knee arthroplasty and presented back with left knee pain, which he has had for some time. He was treated with serial aspirations and injections of Depo-Medrol, but failed to improve.  He is ready now for surgery on his left knee due to persistent primarily medial dull throbbing severe pain, which he has had for several years which is constant during the day and unrelieved by any pain medications or injections.  He has lost function, has persistent stiffness and swelling, which has become more frequent and recurrent.  ALLERGIES:  He does not have any known drug allergies.  PAST MEDICAL HISTORY:  Noted for hypertension and high cholesterol.  He had a right total knee arthroplasty in October 2010 with a Smith and Nephew endpoint and he is also had foot surgery, facial surgery, back surgery and surgery on his colon.  FAMILY HISTORY:  Arthritis, asthma.  SOCIAL HISTORY:  He is married.  REVIEW OF SYSTEMS:  Negative for all 13/14 systems except for the musculoskeletal system as stated.  PHYSICAL EXAMINATION:  GENERAL:  Shows a well-developed, well-nourished male, normal body habitus.  No gross deformities.  Normal grooming. PULMONARY:  He has clear lungs. CARDIOVASCULAR:  Heart rate and rhythm are normal. ABDOMEN:  Bowel sounds are soft. NEURO:  He is awake, alert and oriented x3.  His mood and affect are normal.  He has a normal neurologic exam. EXTREMITIES:  His knee exam shows a small joint effusion.  His flexion is 115 degrees.  He is stable in  the anteromedial direction as well as the medial lateral direction.  He does have a small flexion contracture. His upper extremities are normal in appearance.  Have normal range of motion, strength and stability.  His right knee has 120 degrees of flexion.  No tenderness.  No swelling.  Joint is stable.  His incision looks good.  IMPRESSION:  Osteoarthritis left knee.  ICD-9 Code 715.16.  We planned procedure of left total knee arthroplasty with a DePuy implant.  PLAN:  Surgery for October 15, 2010 with a followup visit scheduled for October 29, 2010.  Plan to go home after surgery.  Planned procedure code is [id number].     Vickki Hearing, M.D.     SEH/MEDQ  D:  10/11/2010  T:  10/11/2010  Job:  [id number]  Electronically Signed by Fuller Canada M.D. on 10/29/2010 01:38:42 PM

## 2010-10-29 ENCOUNTER — Ambulatory Visit: Payer: BC Managed Care – PPO | Admitting: Orthopedic Surgery

## 2010-10-29 NOTE — Discharge Summary (Signed)
  NAME:  Devin Callahan, Devin Callahan NO.:  [id number]  MEDICAL RECORD NO.:  [id number]           PATIENT TYPE:  I  LOCATION:  A212                          FACILITY:  [facility]  PHYSICIAN:  Vickki Hearing, M.D.DATE OF BIRTH:  June 22, 1946  DATE OF ADMISSION:  10/15/2010 DATE OF DISCHARGE:  03/15/2012LH                              DISCHARGE SUMMARY   ADMITTING DIAGNOSIS:  Osteoarthritis, left knee.  DISCHARGE DIAGNOSIS:  Osteoarthritis, left knee.  PROCEDURE:  Left total knee arthroplasty.  OPERATING SURGEON:  Vickki Hearing, MD.  IMPLANTS:  DePuy size 5 tibia, size 4 femur, size 12.5 polyethylene insert, size 38 dome patella.  Implant type:  Fixed bearing and posterior stabilized Sigma implant.  HISTORY:  A 65 year old male status post right total knee arthroplasty presented for left total knee arthroplasty secondary to disabling left knee pain.  HOSPITAL COURSE:  The patient was brought in for surgery, had an uncomplicated procedure.  Postoperative films were obtained showed adequate reconstruction of the left knee with knee replacement.  He did well in the in therapy, ambulating 200 feet.  His grange of motion was -4 degrees of extension - 60 degrees of flexion.  Laboratory results show hemoglobin 11.5.  Metabolic panel, potassium is 4.0.  I did supplement his potassium while he was here with 20 mEq t.i.d.  He is afebrile.  His vital signs are stable.  His blood pressure was 149/76 on 10 mg of amlodipine  His discharge medications are in the med rec per discharge manager will include Ecotrin twice a day for [redacted] weeks along with TED hose for DVT prophylaxis.  He will be on hydrocodone 5 mg/10 mg q.4 p.r.n. for pain. He will be on Robaxin 500 mg q.6 p.r.n. for spasm.  He will be on a stool softener.  He will resume his preadmission medications.  His followup visit is for March 26.  He has home health physical therapy.  He is weightbearing as tolerated.  He  will be on CPM machine 6 hours per day.  His condition is stable and disposition is he is going back to his home.     Vickki Hearing, M.D.     SEH/MEDQ  D:  10/18/2010  T:  10/18/2010  Job:  [id number]  Electronically Signed by Fuller Canada M.D. on 10/29/2010 01:38:40 PM

## 2010-10-29 NOTE — Op Note (Signed)
NAME:  Devin Callahan, Devin Callahan NO.:  [id number]  MEDICAL RECORD NO.:  [id number]           PATIENT TYPE:  I  LOCATION:  A212                          FACILITY:  [facility]  PHYSICIAN:  Vickki Hearing, M.D.DATE OF BIRTH:  01/04/1946  DATE OF PROCEDURE: DATE OF DISCHARGE:                              OPERATIVE REPORT   HISTORY:  A 65 year old male status post right total knee with a Smith and Nephew implant came back for left total knee at this time was a DePuy implant secondary to disabling left knee pain.  PREOPERATIVE DIAGNOSIS:  Osteoarthritis, left knee.  POSTOPERATIVE DIAGNOSIS:  Osteoarthritis, left knee.  PROCEDURE:  Left total knee arthroplasty.  SURGEON:  Vickki Hearing, MD  Assisted by [facility] and Lucrezia Europe.  ANESTHETIC:  Spinal.  IMPLANTS:  DePuy Sigma fixed bearing total knee implant, size 4 femur, size 5 tibia, size 12.5 polyethylene insert and size 38 patella.  OPERATIVE FINDINGS:  Mild to moderate varus osteoarthritis, 10-degree preop flexion contracture, 100 degrees of preop flexion under anesthesia, severe wear in the medial side of the joint, both tibial and femoral sides, multiple osteophytes.  DETAILS OF PROCEDURE:  The patient marked his left knee as the operative site and then it was countersigned, chart was updated, and he was taken to surgery, he had a gram of Ancef for spinal anesthetic, was placed supine on the operating table.  Foley catheter was placed followed by application of a tourniquet and then his leg was prepped and draped with sterile technique.  After the time-out was executed, the limb was exsanguinated with a 6- inch Esmarch.  Tourniquet was elevated to 300 mmHg where it stayed for 92 minutes.  He was placed in flexion and a midline incision was made. Subcutaneous tissue was divided.  Medial arthrotomy was performed. Synovectomy was performed.  ACL and PCL were resected.  Medial, lateral menisci, anterior  horns were removed.  Dissection was carried out to the mid coronal plane of the tibia.  Patella fat pad was excised.  Patella was everted.  The femur was prepared by inserting a drill into the femoral canal, decompressing it with suction and irrigation until the fluid was clear. A 5-degree left femoral cutting jig was pinned with 410 mm distal resection.  After resecting the distal femur with an oscillating saw, this cut was checked for flatness and we proceeded to measure the femur to a size 4 and then pinned it in slight external rotation, made the 4 distal cut.  Clean out the posterior compartment of the medial and lateral portions of the joint of bone and soft tissue, we resected the remaining medial and lateral menisci and the remnants of the PCL.  Tibia was then set for a neutral cut with posterior slope for about 3 degrees and we referenced the lateral condyle of the tibia and measured for an 8-mm resection block was pinned and the resection was made.  The shingle was checked for accuracy and we proceeded to check theflexion extension gaps.  A 12.5 block was placed followed by a 10-mm block, but 12.5 gave the  best fit and stability.  The extension gap was checked with a laminar spreader and the extension gap was balanced.  We placed the cutting jig for the box cut, made the 3 cuts to make the box, then did a trial, set the tibial rotation, marked it, remove the trial components, measured the patella to a 23-mm thickness, measured for a 9-mm resection, this resection was made with an oscillating saw, the patellar bone left measured 14 mm, we made a 3-peg holes.  We then irrigated the joint, dried the bone, prepared the cement, placed the implants and allowed the cement to cure, checked the knee for range of motion and then placed the 12.5-mm polyethylene insert.  We irrigated again, removed any excess cement.  We injected the knee with 30 mL of Marcaine, closed the  arthrotomy with #1 Bralon in interrupted and running fashion, injected a second dose of 30 mL of Marcaine in the joint and then closed the subcu with 0 and 2-0 Monocryl, skin reapproximated with staples.  Radiograph was taken, the implant was in appropriate position with slight hyperextension on the lateral.  Otherwise, radiographed was acceptable.  The patient will be on Ecotrin twice a day for [redacted] weeks along with foot pumps in the hospital.  He also have TED hose for 6 weeks.  Standard total knee protocol.   DICTATION ENDS AT THIS POINT.     Vickki Hearing, M.D.     SEH/MEDQ  D:  10/15/2010  T:  10/16/2010  Job:  [id number]  Electronically Signed by Fuller Canada M.D. on 10/29/2010 01:38:44 PM

## 2010-10-30 ENCOUNTER — Ambulatory Visit (INDEPENDENT_AMBULATORY_CARE_PROVIDER_SITE_OTHER): Payer: BC Managed Care – PPO | Admitting: Orthopedic Surgery

## 2010-10-30 DIAGNOSIS — Z96659 Presence of unspecified artificial knee joint: Secondary | ICD-10-CM

## 2010-10-30 NOTE — Patient Instructions (Signed)
Continue therapy   Continue aspirin x 4 weeks   Continue stockings x 4 weeks

## 2010-10-30 NOTE — Progress Notes (Signed)
Postoperative visit This is postop visit # One  Diagnosis osteoarthritis, LEFT knee.  Procedure LEFT total knee arthroplasty.  Date of surgery was March 12  Implant Depew fixed-bearing posterior stabilized knee replacement.  Patient is functioning well, ambulating with his walker, home health, is coming to the home. He has mild discomfort at this point.    The knee incision looks really good. He has minimal swelling is wearing TED hose. He is on aspirin twice a day for a total of 6 weeks, along with the stockings.

## 2010-11-08 LAB — CROSSMATCH
ABO/RH(D): A POS
Antibody Screen: NEGATIVE

## 2010-11-08 LAB — BASIC METABOLIC PANEL
BUN: 10 mg/dL (ref 6–23)
BUN: 13 mg/dL (ref 6–23)
BUN: 18 mg/dL (ref 6–23)
Calcium: 8.6 mg/dL (ref 8.4–10.5)
Calcium: 8.8 mg/dL (ref 8.4–10.5)
Chloride: 100 mEq/L (ref 96–112)
Chloride: 101 mEq/L (ref 96–112)
Chloride: 99 mEq/L (ref 96–112)
Chloride: 99 mEq/L (ref 96–112)
Creatinine, Ser: 0.66 mg/dL (ref 0.4–1.5)
Creatinine, Ser: 1.04 mg/dL (ref 0.4–1.5)
Creatinine, Ser: 2.25 mg/dL — ABNORMAL HIGH (ref 0.4–1.5)
GFR calc Af Amer: 36 mL/min — ABNORMAL LOW (ref >60)
GFR calc Af Amer: >60 mL/min (ref >60)
GFR calc Af Amer: >60 mL/min (ref >60)
GFR calc non Af Amer: >60 mL/min (ref >60)
GFR calc non Af Amer: >60 mL/min (ref >60)
GFR calc non Af Amer: >60 mL/min (ref >60)
Glucose, Bld: 97 mg/dL (ref 70–99)
Potassium: 3.7 mEq/L (ref 3.5–5.1)
Potassium: 3.7 mEq/L (ref 3.5–5.1)
Sodium: 137 mEq/L (ref 135–145)
Sodium: 138 mEq/L (ref 135–145)
Sodium: 140 mEq/L (ref 135–145)

## 2010-11-08 LAB — DIFFERENTIAL
Basophils Absolute: 0.2 10*3/uL — ABNORMAL HIGH (ref 0.0–0.1)
Basophils Relative: 0 % (ref 0–1)
Basophils Relative: 3 % — ABNORMAL HIGH (ref 0–1)
Eosinophils Absolute: 0 10*3/uL (ref 0.0–0.7)
Eosinophils Absolute: 1 10*3/uL — ABNORMAL HIGH (ref 0.0–0.7)
Eosinophils Relative: 10 % — ABNORMAL HIGH (ref 0–5)
Eosinophils Relative: 7 % — ABNORMAL HIGH (ref 0–5)
Lymphocytes Relative: 10 % — ABNORMAL LOW (ref 12–46)
Lymphocytes Relative: 10 % — ABNORMAL LOW (ref 12–46)
Lymphocytes Relative: 11 % — ABNORMAL LOW (ref 12–46)
Lymphocytes Relative: 27 % (ref 12–46)
Lymphs Abs: 1 10*3/uL (ref 0.7–4.0)
Lymphs Abs: 1 10*3/uL (ref 0.7–4.0)
Lymphs Abs: 1 10*3/uL (ref 0.7–4.0)
Lymphs Abs: 1 10*3/uL (ref 0.7–4.0)
Metamyelocytes Relative: 0 %
Monocytes Absolute: 0.9 10*3/uL (ref 0.1–1.0)
Monocytes Relative: 11 % (ref 3–12)
Monocytes Relative: 11 % (ref 3–12)
Monocytes Relative: 11 % (ref 3–12)
Monocytes Relative: 7 % (ref 3–12)
Neutro Abs: 11.4 10*3/uL — ABNORMAL HIGH (ref 1.7–7.7)
Neutro Abs: 7.3 10*3/uL (ref 1.7–7.7)
Neutrophils Relative %: 72 % (ref 43–77)
Neutrophils Relative %: 82 % — ABNORMAL HIGH (ref 43–77)
Promyelocytes Absolute: 0 %

## 2010-11-08 LAB — CBC
HCT: 32.4 % — ABNORMAL LOW (ref 39.0–52.0)
HCT: 32.5 % — ABNORMAL LOW (ref 39.0–52.0)
Hemoglobin: 10.9 g/dL — ABNORMAL LOW (ref 13.0–17.0)
Hemoglobin: 11 g/dL — ABNORMAL LOW (ref 13.0–17.0)
Hemoglobin: 14 g/dL (ref 13.0–17.0)
MCHC: 33.7 g/dL (ref 30.0–36.0)
MCV: 88.6 fL (ref 78.0–100.0)
MCV: 88.6 fL (ref 78.0–100.0)
MCV: 88.8 fL (ref 78.0–100.0)
MCV: 89.4 fL (ref 78.0–100.0)
Platelets: 147 10*3/uL — ABNORMAL LOW (ref 150–400)
Platelets: 148 10*3/uL — ABNORMAL LOW (ref 150–400)
Platelets: 173 10*3/uL (ref 150–400)
Platelets: 184 10*3/uL (ref 150–400)
RBC: 3.66 MIL/uL — ABNORMAL LOW (ref 4.22–5.81)
RBC: 3.7 MIL/uL — ABNORMAL LOW (ref 4.22–5.81)
RBC: 4.29 MIL/uL (ref 4.22–5.81)
RDW: 14.3 % (ref 11.5–15.5)
RDW: 14.9 % (ref 11.5–15.5)
WBC: 10.1 10*3/uL (ref 4.0–10.5)
WBC: 10.8 10*3/uL — ABNORMAL HIGH (ref 4.0–10.5)
WBC: 14 10*3/uL — ABNORMAL HIGH (ref 4.0–10.5)
WBC: 6.2 10*3/uL (ref 4.0–10.5)
WBC: 9.9 10*3/uL (ref 4.0–10.5)

## 2010-11-08 LAB — PROTIME-INR
INR: 0.95 (ref 0.00–1.49)
INR: 1.09 (ref 0.00–1.49)
INR: 1.56 — ABNORMAL HIGH (ref 0.00–1.49)
Prothrombin Time: 12.6 seconds (ref 11.6–15.2)
Prothrombin Time: 14 seconds (ref 11.6–15.2)
Prothrombin Time: 18.5 seconds — ABNORMAL HIGH (ref 11.6–15.2)
Prothrombin Time: 18.7 seconds — ABNORMAL HIGH (ref 11.6–15.2)

## 2010-11-13 ENCOUNTER — Telehealth: Payer: Self-pay | Admitting: Orthopedic Surgery

## 2010-11-13 NOTE — Telephone Encounter (Signed)
Patient states needs order for out-patient PT following discharge from home therapy. His ph# is [phone]. Asking if this has been done yet?

## 2010-11-14 ENCOUNTER — Ambulatory Visit (HOSPITAL_COMMUNITY)
Admission: RE | Admit: 2010-11-14 | Discharge: 2010-11-14 | Disposition: A | Discharge status: Home / Self Care | Payer: BC Managed Care – PPO | Source: Ambulatory Visit | Attending: Orthopedic Surgery | Admitting: Orthopedic Surgery

## 2010-11-14 ENCOUNTER — Other Ambulatory Visit: Payer: Self-pay | Admitting: *Deleted

## 2010-11-14 DIAGNOSIS — IMO0001 Reserved for inherently not codable concepts without codable children: Secondary | ICD-10-CM | POA: Insufficient documentation

## 2010-11-14 DIAGNOSIS — M6281 Muscle weakness (generalized): Secondary | ICD-10-CM | POA: Insufficient documentation

## 2010-11-14 DIAGNOSIS — Z96659 Presence of unspecified artificial knee joint: Secondary | ICD-10-CM

## 2010-11-14 DIAGNOSIS — M25669 Stiffness of unspecified knee, not elsewhere classified: Secondary | ICD-10-CM | POA: Insufficient documentation

## 2010-11-14 DIAGNOSIS — R262 Difficulty in walking, not elsewhere classified: Secondary | ICD-10-CM | POA: Insufficient documentation

## 2010-11-14 DIAGNOSIS — M25569 Pain in unspecified knee: Secondary | ICD-10-CM | POA: Insufficient documentation

## 2010-11-14 NOTE — Telephone Encounter (Signed)
This is the first note that I have received, I will fax order to [facility] is this correct?

## 2010-11-14 NOTE — Telephone Encounter (Signed)
Yes, correct.    Per nurse, order done and faxed to [facility] and patient notified.

## 2010-11-19 ENCOUNTER — Ambulatory Visit (HOSPITAL_COMMUNITY)
Admission: RE | Admit: 2010-11-19 | Discharge: 2010-11-19 | Disposition: A | Discharge status: Home / Self Care | Payer: BC Managed Care – PPO | Source: Ambulatory Visit | Attending: Family Medicine | Admitting: Family Medicine

## 2010-11-21 ENCOUNTER — Ambulatory Visit (HOSPITAL_COMMUNITY)
Admission: RE | Admit: 2010-11-21 | Discharge: 2010-11-21 | Disposition: A | Discharge status: Home / Self Care | Payer: BC Managed Care – PPO | Source: Ambulatory Visit | Attending: Family Medicine | Admitting: Family Medicine

## 2010-11-23 ENCOUNTER — Ambulatory Visit (HOSPITAL_COMMUNITY)
Admission: RE | Admit: 2010-11-23 | Discharge: 2010-11-23 | Disposition: A | Discharge status: Home / Self Care | Payer: BC Managed Care – PPO | Source: Ambulatory Visit

## 2010-11-26 ENCOUNTER — Ambulatory Visit (HOSPITAL_COMMUNITY)
Admission: RE | Admit: 2010-11-26 | Discharge: 2010-11-26 | Disposition: A | Discharge status: Home / Self Care | Payer: BC Managed Care – PPO | Source: Ambulatory Visit | Attending: Family Medicine | Admitting: Family Medicine

## 2010-11-27 ENCOUNTER — Encounter: Payer: Self-pay | Admitting: Orthopedic Surgery

## 2010-11-27 ENCOUNTER — Ambulatory Visit (INDEPENDENT_AMBULATORY_CARE_PROVIDER_SITE_OTHER): Payer: BC Managed Care – PPO | Admitting: Orthopedic Surgery

## 2010-11-27 DIAGNOSIS — Z96659 Presence of unspecified artificial knee joint: Secondary | ICD-10-CM

## 2010-11-27 DIAGNOSIS — Z96652 Presence of left artificial knee joint: Secondary | ICD-10-CM | POA: Insufficient documentation

## 2010-11-27 MED ORDER — METHOCARBAMOL 500 MG PO TABS: 500.0000 mg | ORAL_TABLET | Freq: Four times a day (QID) | ORAL | Status: DC

## 2010-11-27 NOTE — Patient Instructions (Signed)
Continue physical therapy

## 2010-11-27 NOTE — Progress Notes (Signed)
The knee looks good status post knee replacement on March 12  LEFT total knee  Implant Depew fixed bearing posterior stabilized knee  Patient is ambulating now with a cane and his incision looks good.  His knee flexion is about 80  Continue physical therapy and come back in 6 weeks

## 2010-11-28 ENCOUNTER — Ambulatory Visit (HOSPITAL_COMMUNITY)
Admission: RE | Admit: 2010-11-28 | Discharge: 2010-11-28 | Disposition: A | Discharge status: Home / Self Care | Payer: BC Managed Care – PPO | Source: Ambulatory Visit | Attending: Family Medicine | Admitting: Family Medicine

## 2010-11-30 ENCOUNTER — Ambulatory Visit (HOSPITAL_COMMUNITY)
Admission: RE | Admit: 2010-11-30 | Discharge: 2010-11-30 | Disposition: A | Discharge status: Home / Self Care | Payer: BC Managed Care – PPO | Source: Ambulatory Visit | Attending: Family Medicine | Admitting: Family Medicine

## 2010-12-04 ENCOUNTER — Inpatient Hospital Stay (HOSPITAL_COMMUNITY)
Admission: RE | Admit: 2010-12-04 | Discharge: 2010-12-04 | Disposition: A | Discharge status: Home / Self Care | Payer: BC Managed Care – PPO | Source: Ambulatory Visit

## 2010-12-05 ENCOUNTER — Ambulatory Visit (HOSPITAL_COMMUNITY)
Admission: RE | Admit: 2010-12-05 | Discharge: 2010-12-05 | Disposition: A | Discharge status: Home / Self Care | Payer: BC Managed Care – PPO | Source: Ambulatory Visit | Attending: Orthopedic Surgery | Admitting: Orthopedic Surgery

## 2010-12-05 DIAGNOSIS — M25669 Stiffness of unspecified knee, not elsewhere classified: Secondary | ICD-10-CM | POA: Insufficient documentation

## 2010-12-05 DIAGNOSIS — IMO0001 Reserved for inherently not codable concepts without codable children: Secondary | ICD-10-CM | POA: Insufficient documentation

## 2010-12-05 DIAGNOSIS — M25569 Pain in unspecified knee: Secondary | ICD-10-CM | POA: Insufficient documentation

## 2010-12-05 DIAGNOSIS — M6281 Muscle weakness (generalized): Secondary | ICD-10-CM | POA: Insufficient documentation

## 2010-12-05 DIAGNOSIS — R262 Difficulty in walking, not elsewhere classified: Secondary | ICD-10-CM | POA: Insufficient documentation

## 2010-12-07 ENCOUNTER — Ambulatory Visit (HOSPITAL_COMMUNITY)
Admission: RE | Admit: 2010-12-07 | Discharge: 2010-12-07 | Disposition: A | Discharge status: Home / Self Care | Payer: BC Managed Care – PPO | Source: Ambulatory Visit | Attending: Family Medicine | Admitting: Family Medicine

## 2010-12-10 ENCOUNTER — Ambulatory Visit (HOSPITAL_COMMUNITY)
Admission: RE | Admit: 2010-12-10 | Discharge: 2010-12-10 | Disposition: A | Discharge status: Home / Self Care | Payer: BC Managed Care – PPO | Source: Ambulatory Visit | Attending: Family Medicine | Admitting: Family Medicine

## 2010-12-12 ENCOUNTER — Ambulatory Visit (HOSPITAL_COMMUNITY)
Admission: RE | Admit: 2010-12-12 | Discharge: 2010-12-12 | Disposition: A | Discharge status: Home / Self Care | Payer: BC Managed Care – PPO | Source: Ambulatory Visit | Attending: Family Medicine | Admitting: Family Medicine

## 2010-12-14 ENCOUNTER — Ambulatory Visit (HOSPITAL_COMMUNITY)
Admission: RE | Admit: 2010-12-14 | Discharge: 2010-12-14 | Disposition: A | Discharge status: Home / Self Care | Payer: BC Managed Care – PPO | Source: Ambulatory Visit | Attending: Family Medicine | Admitting: Family Medicine

## 2010-12-17 ENCOUNTER — Ambulatory Visit (HOSPITAL_COMMUNITY)
Admission: RE | Admit: 2010-12-17 | Discharge: 2010-12-17 | Disposition: A | Discharge status: Home / Self Care | Payer: BC Managed Care – PPO | Source: Ambulatory Visit | Attending: Family Medicine | Admitting: Family Medicine
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
## 2010-12-18 NOTE — Op Note (Signed)
NAME:  Devin Callahan, Devin Callahan NO.:  [id number]   MEDICAL RECORD NO.:  [id number]          PATIENT TYPE:  AMB   LOCATION:  DAY                           FACILITY:  [facility]   PHYSICIAN:  R. Roetta Sessions, M.D. DATE OF BIRTH:  Dec 06, 1945   DATE OF PROCEDURE:  06/22/2007  DATE OF DISCHARGE:  06/22/2007                               OPERATIVE REPORT   PROCEDURE:  Small bowel Givens capsule endoscopy.   INDICATIONS FOR PROCEDURE:  The patient is a 65 year old male with a  history of intermittent melena in the setting of nonsteroidal anti-  inflammatory agents use.  He had an episode in October of 2008.  He had  an EGD and colonoscopy back in 2005 without significant findings.  Subsequent EGD was performed by Dr. Jena Gauss on June 17, 2007.  He was  found to have a mild erosive reflux esophagitis and noncritical  Shotzky's ring and a small hiatal hernia.  There was nothing to explain  his GI bleeding.  Therefore we agreed to pursue Givens capsule study to  look for small bowel etiology.  Last hemoglobin was 9.6 with hematocrit  of 29.9 and MCV of 80.6 on June 17, 2007.   FINDINGS:  The gastric passage time was 18 minutes with the first  gastric image at 1 minute 39 seconds.  He had a few tiny nonbleeding  gastric erosions.  First duodenal image was at 19 minutes 47 seconds.  At 1 hour 16 minutes 15 seconds, the beginnings of a large irregular,  ill-appearing, bleeding mass is noted.  Multiple images were taken  through 1 hour 17 minutes 27 seconds.  The capsule does rapidly  transverse this lesion and it is nonobstructing.  Another small polypoid  lesion is noted at 1 hour 41 minutes 53 seconds.  Two small polypoid  lesions noted at 1 hour 51 minutes 28 seconds.  Another lesion noted at  4 to 5 o'clock at 1 hour 51 minutes 47 seconds as well as 1 hour 52  minutes 14 seconds.  A kidney shaped polypoid lesion is noted between 2  and 4 o'clock at 1 hour 55 minutes 27 seconds.   Another polypoid lesion  at 2 hours 1 minute 28 seconds.  Irregularity and possible polypoid  lesion at 9 o'clock at 2 hours 5 minutes 7 seconds, 2 hours 5 minutes 34  seconds, and again at 2 hours 8 minutes 22 seconds between 3 and 7  o'clock.  Another large irregular polypoid lesion is noted as well as at  2 hours 19 minutes 41 seconds at 10 o'clock.  Small bowel transit time  was 3 hours 46 minutes.  First ileocecal valve image is at 4 hours 5  minutes 19 seconds.  First cecal image at 4 hours 5 minutes 55 seconds.   RECOMMENDATIONS:  Large oozing bleeding polypoid mass noted in the small  bowel at 1 hour 16 minutes 15 seconds.  There are subsequent numerous  smaller polyps noted throughout the remainder of the distal small bowel.  This would be the culprit of his melena and  anemia.  Films have been  reviewed with Dr. Jena Gauss.  The patient is to be  referred to [facility] for  laparoscopy with intraoperative enteroscopy and removal of multiple  small bowel lesions suspicious for carcinoma.  Dr. Jena Gauss is contacting  the patient to go over results today.      Lorenza Burton, N.P.      Jonathon Bellows, M.D.  Electronically Signed    KJ/MEDQ  D:  06/25/2007  T:  06/25/2007  Job:  [id number]   cc:   Angus G. Renard Matter, MD  Fax: [phone]

## 2010-12-18 NOTE — Op Note (Signed)
NAME:  Devin Callahan, Devin Callahan NO.:  [id number]   MEDICAL RECORD NO.:  [id number]          PATIENT TYPE:  AMB   LOCATION:  DAY                           FACILITY:  [facility]   PHYSICIAN:  R. Roetta Sessions, M.D. DATE OF BIRTH:  03-Sep-1945   DATE OF PROCEDURE:  06/17/2007  DATE OF DISCHARGE:                               OPERATIVE REPORT   PROCEDURE:  Diagnostic esophagogastroduodenoscopy.   INDICATIONS FOR PROCEDURE:  A 65 year old gentleman, history of  intermittent melena in a setting of nonsteroidal inflammatory drug use.  He had an episode just prior to seeing [place] last month.  He has not had  any subsequent episodes.  He had an EGD and colonoscopy back in 2005  without significant findings.  EGD is now being done.  This approach has  been discussed with the patient at length.  Potential risks, benefits  and alternatives have been reviewed, questions answered, he is  agreeable.  Please see documentation in the medical record.   PROCEDURE NOTE:  O2 saturation, blood pressure, pulse and respirations  monitored throughout entire procedure.   CONSCIOUS SEDATION:  Versed 3 mg IV, Demerol 75 mg IV in divided doses.   INSTRUMENT:  Pentax video chip system.   Cetacaine spray for topical anesthesia.   FINDINGS:  Examination tubular esophagus revealed a couple of tiny  erosions overlying a Schatzki's ring which appeared to be noncritical.  Otherwise, esophageal mucosa appeared normal.  EG junction easily  traversed.  Stomach:  Gastric cavity was empty.  It insufflated well with air.  Thorough examination of gastric mucosa including retroflexion in  proximal stomach, esophagogastric junction demonstrated only a hiatal  hernia.  Pylorus patent, easily traversed.  Examination of the bulb,  second portion revealed no abnormalities.   THERAPEUTIC/DIAGNOSTIC MANEUVERS PERFORMED:  None.   The patient tolerated procedure, was reacted in endoscopy.   IMPRESSION:  Couple of tiny  distal esophageal erosions consistent with  mild erosive reflux esophagitis, noncritical Schatzki's ring, otherwise  normal esophagus, small hiatal hernia, otherwise normal stomach, D1, D2.   RECOMMENDATIONS:  1. CBC today.  2. Proceed with a Given small bowel capsule study to image small      bowel.  Further recommendations to follow.      Jonathon Bellows, M.D.  Electronically Signed     RMR/MEDQ  D:  06/17/2007  T:  06/18/2007  Job:  [id number]   cc:   Angus G. Renard Matter, MD  Fax: [phone]

## 2010-12-19 ENCOUNTER — Ambulatory Visit (HOSPITAL_COMMUNITY)
Admission: RE | Admit: 2010-12-19 | Discharge: 2010-12-19 | Disposition: A | Discharge status: Home / Self Care | Payer: BC Managed Care – PPO | Source: Ambulatory Visit | Attending: Family Medicine | Admitting: Family Medicine

## 2010-12-21 ENCOUNTER — Ambulatory Visit (HOSPITAL_COMMUNITY)
Admission: RE | Admit: 2010-12-21 | Discharge: 2010-12-21 | Disposition: A | Discharge status: Home / Self Care | Payer: BC Managed Care – PPO | Source: Ambulatory Visit | Attending: Family Medicine | Admitting: Family Medicine

## 2010-12-21 NOTE — Op Note (Signed)
NAME:  TAIGE, HOUSMAN NO.:  [id number]   MEDICAL RECORD NO.:  [id number]          PATIENT TYPE:  AMB   LOCATION:  DAY                           FACILITY:  [facility]   PHYSICIAN:  Lionel December, M.D.    DATE OF BIRTH:  17-Nov-1945   DATE OF PROCEDURE:  07/19/2004  DATE OF DISCHARGE:                                 OPERATIVE REPORT   PROCEDURES:  Esophagogastroduodenoscopy, followed by total colonoscopy.   Devin Callahan is a 65 year old Caucasian male who was recently seen by Dr. Renard Matter  with a three-day history of melena and heme-positive stools.  We saw him  yesterday in the office and his stool was strongly guaiac-positive.  He does  not have any symptoms.  Denies abdominal pain, change in his bowel habits,  heartburn, dysphagia, or epigastric pain.  He uses OTC Advil but not on a  regular basis.  The procedure risks were reviewed with the patient, and  informed consent was obtained.   PREMEDICATION:  Cetacaine spray for oropharyngeal topical anesthesia,  Demerol 50 mg IV, Versed 6 mg IV in divided dose.   FINDINGS:  Procedure performed in endoscopy suite.  The patient's vital  signs and O2 saturation were monitored during the procedure and remained  stable.   PROCEDURE #1:  Esophagogastroduodenoscopy.  The patient was placed in the  left lateral recumbent position and the Olympus video scope was passed via  oropharynx without any difficulty into esophagus.   Esophagus:  Mucosa of the esophagus was normal.  There was erosion at GE  junction.  The GE junction was at 40 cm from the incisors.  Hiatus was at 43  cm.   Stomach:  It was empty and distended very well with insufflation.  Folds of  the proximal stomach were normal.  Examination of the mucosa at body,  antrum, pyloric channel, as well as angularis, fundus, and cardia, was  normal.   Duodenum:  Examination of the bulb and postbulbar duodenum was also normal.  The endoscope was withdrawn and the patient prepared  for procedure #2.   PROCEDURE #2:  Colonoscopy.   Rectal examination performed.  No abnormality noted on external or digital  exam.  The Olympus video scope was placed in the rectum and advanced under  vision into sigmoid colon and beyond.  Preparation was satisfactory.  The  scope was advanced into cecum, which was identified by ileocecal valve and  appendiceal orifice.  Pictures taken for the record.  As the scope was  withdrawn, colonic mucosa was carefully examined and was normal throughout.  There was a single diverticulum at sigmoid colon noted on the way in, but  there was no clot in it.  The mucosa of the rest of the colon was normal.  Rectal mucosa similarly was normal.  The scope was retroflexed to examine  the anorectal junction, and small hemorrhoids were noted below the dentate  line.  The endoscope was straightened and withdrawn.  The patient tolerated  the procedure well.   FINAL DIAGNOSES:  1.  Erosive reflux esophagitis and a small sliding  hiatal hernia.  2.  No evidence of peptic ulcer disease.  3.  Normal colonoscopy except single small diverticulum at sigmoid colon and      small external hemorrhoids.  4.  No fresh or old blood noted in the colon.  5.  The patient could have bled from erosive reflux esophagitis or a      diverticular bleed.  If he has further episodes of bleeding, will      consider a small bowel Given capsule study.   RECOMMENDATIONS:  1.  Can resume his usual medications.  Patient advised not to take Advil      unless absolutely necessary, but he can take OTC Tylenol.  2.  He will contact us should he have tarry stools again.  Otherwise, he      will return for OV in two months.  He will have CBC prior to office      visit.  3.  Antireflux measures.  4.  Protonix 40 mg p.o. q.a.m., Prilosec 20 mg p.o. q.a.m.  5.  The patient advised not to take Advil unless absolutely necessary.  6.  Should he have tarry stools, he will give [place] a call.   Otherwise, he will      return for OV in two months.  He will have CBC prior to that visit.     Naje   NR/MEDQ  D:  07/19/2004  T:  07/19/2004  Job:  [id number]   cc:   Angus G. Renard Matter, MD  [address]  [place]  [place] [address]  Fax: [phone]
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
## 2010-12-21 NOTE — H&P (Signed)
NAME:  Devin Callahan, Devin Callahan NO.:  [id number]   MEDICAL RECORD NO.:  [id number]          PATIENT TYPE:  AMB   LOCATION:  DAY                           FACILITY:  [facility]   PHYSICIAN:  Ky Barban, M.D.DATE OF BIRTH:  09-18-1945   DATE OF ADMISSION:  DATE OF DISCHARGE:  LH                                HISTORY & PHYSICAL   CHIEF COMPLAINT:  Left hydrocele.   HISTORY:  This 65 year old male was seen by me in 2002 with small left  spermatocele which I advised him to leave alone, but it has gradually gotten  worse.  He says it is uncomfortable and wants to get rid of it.  The size of  this mass is about 2 x 2 inches.  He is coming as an outpatient to undergo  left hydrocelectomy.  It will be done as an outpatient.  The procedure  limitations and complications discussed.   PAST HISTORY:  He has hypertension.  He had surgery on his back in 1998 for  pinched nerve.  No other significant medical problems.   PERSONAL HISTORY:  Does not smoke or drink.   REVIEW OF SYSTEMS:  Denies any chest pain, orthopnea, PND, nausea, vomiting.   PHYSICAL EXAMINATION:  Blood pressure is 140/88.  Temperature is normal.  CENTRAL NERVOUS SYSTEM:  No gross neurological deficit.  HEAD AND NECK:  __________  negative.  CHEST:  Symmetrical.  HEART:  Regular sinus rhythm, no murmur.  ABDOMEN:  Soft, flat.  Liver, spleen, kidneys are not palpable.  No CVA  tenderness.  GENITALIA:  He is an uncircumcised male.  __________  Frann Rider are normal.  There is a 2 x 2 inch size mass above the left testicle, transilluminates.  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  RECTAL EXAM:  Sphincter tone is normal.  No rectal mass.  Prostate 1+ smooth  and firm.   IMPRESSION:  :  Left hydrocele.   PLAN:  Left hydrocelectomy under anesthesia as an outpatient.      Ky Barban, M.D.  Electronically Signed     MIJ/MEDQ  D:  05/01/2006  T:   05/01/2006  Job:  [id number]   cc:   Angus G. Renard Matter, MD  Fax: [phone]

## 2010-12-21 NOTE — Op Note (Signed)
NAME:  Devin Callahan, Devin Callahan NO.:  [id number]   MEDICAL RECORD NO.:  [id number]          PATIENT TYPE:  AMB   LOCATION:  DAY                           FACILITY:  [facility]   PHYSICIAN:  Ky Barban, M.D.DATE OF BIRTH:  03-Mar-1946   DATE OF PROCEDURE:  05/02/2006  DATE OF DISCHARGE:  05/02/2006                                 OPERATIVE REPORT   PREOPERATIVE DIAGNOSIS:  Left hydrocele.   POSTOPERATIVE DIAGNOSIS:  Left hydrocele.   PROCEDURE:  Left hydrocelectomy.   ANESTHESIA:  General.   PROCEDURE:  With the patient under general anesthesia, placed in the supine  position __________ prep and drape.  The left hemiscrotum was held by the  assistant and a 1-inch long incision across the mid scrotum was made.  Fascial layers divided with the help of the cautery.  Hydrocele sac was  exposed, it was delivered through the wound.  I can see it is just attached  to the spermatic cord and the upper pole of the testicle grossly looks  normal, so I decided to just remove the sac from the spermatic cord and the  testicle with blunt and sharp dissection.  It came off nicely.  Some of the  bleeders were simply ligated, others were coagulated.  I did not have to  leave any part of the hydrocele sac there, so I removed the sac completely.  After making sure all of the bleeders are coagulated, the testicle was  replaced carefully into the scrotum.  Fascial layers were closed with a  continuous stitch of #3-0 Vicryl.  Skin was closed with #4-0 chromic  horizontal mattress stitches.  A sterile gauze dressing applied.  Patient  left the operating room in satisfactory condition.      Ky Barban, M.D.  Electronically Signed     MIJ/MEDQ  D:  05/02/2006  T:  05/04/2006  Job:  [id number]

## 2010-12-24 ENCOUNTER — Ambulatory Visit (HOSPITAL_COMMUNITY)
Admission: RE | Admit: 2010-12-24 | Discharge: 2010-12-24 | Disposition: A | Discharge status: Home / Self Care | Payer: BC Managed Care – PPO | Source: Ambulatory Visit | Attending: Family Medicine | Admitting: Family Medicine

## 2010-12-27 ENCOUNTER — Ambulatory Visit (HOSPITAL_COMMUNITY)
Admission: RE | Admit: 2010-12-27 | Discharge: 2010-12-27 | Disposition: A | Discharge status: Home / Self Care | Payer: BC Managed Care – PPO | Source: Ambulatory Visit | Attending: Family Medicine | Admitting: Family Medicine

## 2010-12-28 ENCOUNTER — Ambulatory Visit (HOSPITAL_COMMUNITY): Payer: BC Managed Care – PPO | Admitting: Physical Therapy

## 2011-01-01 ENCOUNTER — Ambulatory Visit (HOSPITAL_COMMUNITY)
Admission: RE | Admit: 2011-01-01 | Discharge: 2011-01-01 | Disposition: A | Discharge status: Home / Self Care | Payer: BC Managed Care – PPO | Source: Ambulatory Visit | Attending: Family Medicine | Admitting: Family Medicine

## 2011-01-03 ENCOUNTER — Ambulatory Visit (HOSPITAL_COMMUNITY)
Admission: RE | Admit: 2011-01-03 | Discharge: 2011-01-03 | Disposition: A | Discharge status: Home / Self Care | Payer: BC Managed Care – PPO | Source: Ambulatory Visit | Attending: Family Medicine | Admitting: Family Medicine

## 2011-01-04 ENCOUNTER — Ambulatory Visit (HOSPITAL_COMMUNITY)
Admission: RE | Admit: 2011-01-04 | Discharge: 2011-01-04 | Disposition: A | Discharge status: Home / Self Care | Payer: BC Managed Care – PPO | Source: Ambulatory Visit | Attending: Orthopedic Surgery | Admitting: Orthopedic Surgery

## 2011-01-04 DIAGNOSIS — M6281 Muscle weakness (generalized): Secondary | ICD-10-CM | POA: Insufficient documentation

## 2011-01-04 DIAGNOSIS — R262 Difficulty in walking, not elsewhere classified: Secondary | ICD-10-CM | POA: Insufficient documentation

## 2011-01-04 DIAGNOSIS — M25669 Stiffness of unspecified knee, not elsewhere classified: Secondary | ICD-10-CM | POA: Insufficient documentation

## 2011-01-04 DIAGNOSIS — IMO0001 Reserved for inherently not codable concepts without codable children: Secondary | ICD-10-CM | POA: Insufficient documentation

## 2011-01-04 DIAGNOSIS — M25569 Pain in unspecified knee: Secondary | ICD-10-CM | POA: Insufficient documentation

## 2011-01-08 ENCOUNTER — Ambulatory Visit (INDEPENDENT_AMBULATORY_CARE_PROVIDER_SITE_OTHER): Payer: BC Managed Care – PPO | Admitting: Orthopedic Surgery

## 2011-01-08 ENCOUNTER — Encounter: Payer: Self-pay | Admitting: Orthopedic Surgery

## 2011-01-08 DIAGNOSIS — M25669 Stiffness of unspecified knee, not elsewhere classified: Secondary | ICD-10-CM | POA: Insufficient documentation

## 2011-01-08 NOTE — Progress Notes (Signed)
Previous knee replacement LEFT knee about 10 weeks ago no improvement in flexion after gait at 85 exam shows no bouts in the joint, recommend knee manipulation under anesthesia.  Patient agrees  Patient understands risk of fracture  Patient understands treatment of fractures it occurs  Scheduled for LEFT knee manipulation under anesthesia, dictate or complete history and physical and incorporate our reference

## 2011-01-08 NOTE — Patient Instructions (Signed)
Surgical risks continued pain and stiffness small risk of fracture requiring plate fixation (more surgery)

## 2011-01-11 ENCOUNTER — Telehealth: Payer: Self-pay | Admitting: Orthopedic Surgery

## 2011-01-14 ENCOUNTER — Other Ambulatory Visit: Payer: Self-pay | Admitting: Orthopedic Surgery

## 2011-01-14 ENCOUNTER — Encounter (HOSPITAL_COMMUNITY): Payer: BC Managed Care – PPO

## 2011-01-14 DIAGNOSIS — E876 Hypokalemia: Secondary | ICD-10-CM

## 2011-01-14 LAB — SURGICAL PCR SCREEN
MRSA, PCR: NEGATIVE
Staphylococcus aureus: NEGATIVE

## 2011-01-14 LAB — BASIC METABOLIC PANEL
CO2: 31 mEq/L (ref 19–32)
Chloride: 100 mEq/L (ref 96–112)
Potassium: 3 mEq/L — ABNORMAL LOW (ref 3.5–5.1)
Sodium: 141 mEq/L (ref 135–145)

## 2011-01-14 LAB — HEMOGLOBIN AND HEMATOCRIT, BLOOD: HCT: 36.4 % — ABNORMAL LOW (ref 39.0–52.0)

## 2011-01-14 MED ORDER — POTASSIUM CHLORIDE CRYS ER 20 MEQ PO TBCR: 20.0000 meq | EXTENDED_RELEASE_TABLET | ORAL | Status: DC

## 2011-01-14 NOTE — Telephone Encounter (Addendum)
RE: Surgery scheduled 01/16/11 for in-patient - spoke with French Ana at [facility], ph [phone]. CPT 27570, ICD9 719.56, V43.65. Referring to nurse reviewer.  REF# [id number]. 01/14/11 - Followed up on pre-authorization status. Per [employer], in nurse review. 01/15/11 - Per Marchelle Folks, nurse reviewer, approved for in-patient stay: 6/13 to 01/18/11. States after this date, if additional days as in-patient recommended, hospital to contact insurer with clinicals at that time. Same ref # above.

## 2011-01-15 ENCOUNTER — Encounter: Payer: Self-pay | Admitting: Orthopedic Surgery

## 2011-01-15 NOTE — Progress Notes (Signed)
65 years old status post LEFT total knee replacement.  The patient is failed to progress in physical therapy and has plateaued at approximately 80 of knee flexion.  We have decided to proceed with knee manipulation under anesthesia.  Risks factors of fracture have been discussed with the patient. History reviewed. No pertinent family history. Past Medical History  Diagnosis Date  . High blood pressure     Cholesterol   Past Surgical History  Procedure Date  . Foot surgery   . Back surgery 1999  . Colon surgery 2009  . Knee surgery 3.20.2012    knee replacement LEFT    . Knee surgery     RIGHT knee replacement   History   Social History  . Marital Status: Married    Spouse Name: N/A    Number of Children: N/A  . Years of Education: N/A   Occupational History  . Not on file.   Social History Main Topics  . Smoking status: Not on file  . Smokeless tobacco: Not on file  . Alcohol Use:   . Drug Use:   . Sexually Active:    Other Topics Concern  . Not on file   Social History Narrative  . No narrative on file    Vital signs Will be recorded at the time of surgery. General appearance is normal, he is a large male.  The patient is alert and oriented x3  The patient's mood and affect are normal  The patient is ambulating with a   New placement gait pattern The cardiovascular exam reveals normal pulses and temperature without edema swelling.  The lymphatic system is negative for palpable lymph nodes  The sensory exam is normal.  There are no pathologic reflexes.  Balance is normal.  Body area: LEFT knee  No joint effusion, incision healed clean dry and intact With flexion contracture  Muscle tone and strength the mandible.  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

## 2011-01-16 ENCOUNTER — Inpatient Hospital Stay (HOSPITAL_COMMUNITY)
Admission: RE | Admit: 2011-01-16 | Discharge: 2011-01-19 | DRG: 244 | Disposition: A | Discharge status: Home Health | Payer: BC Managed Care – PPO | Source: Ambulatory Visit | Attending: Orthopedic Surgery | Admitting: Orthopedic Surgery

## 2011-01-16 DIAGNOSIS — E876 Hypokalemia: Secondary | ICD-10-CM | POA: Diagnosis present

## 2011-01-16 DIAGNOSIS — M24569 Contracture, unspecified knee: Secondary | ICD-10-CM

## 2011-01-16 DIAGNOSIS — Z96659 Presence of unspecified artificial knee joint: Secondary | ICD-10-CM

## 2011-01-16 DIAGNOSIS — I10 Essential (primary) hypertension: Secondary | ICD-10-CM | POA: Diagnosis present

## 2011-01-16 DIAGNOSIS — Z01812 Encounter for preprocedural laboratory examination: Secondary | ICD-10-CM

## 2011-01-16 DIAGNOSIS — M24669 Ankylosis, unspecified knee: Principal | ICD-10-CM | POA: Diagnosis present

## 2011-01-16 LAB — POCT I-STAT 4, (NA,K, GLUC, HGB,HCT)
Glucose, Bld: 102 mg/dL — ABNORMAL HIGH (ref 70–99)
HCT: 42 % (ref 39.0–52.0)
Hemoglobin: 14.3 g/dL (ref 13.0–17.0)
Potassium: 3.2 mEq/L — ABNORMAL LOW (ref 3.5–5.1)
Sodium: 141 mEq/L (ref 135–145)

## 2011-01-22 ENCOUNTER — Telehealth: Payer: Self-pay | Admitting: Orthopedic Surgery

## 2011-01-22 NOTE — Telephone Encounter (Signed)
[name]/[facility] needs to verify that you ordered for Devin Callahan : 1.  PT 5 times a week for one week  2.  Can she use heat? Her # [phone]

## 2011-01-22 NOTE — Telephone Encounter (Signed)
Yes i did

## 2011-01-22 NOTE — Telephone Encounter (Signed)
[name]/[facility] also need to know if she can use heat on Mr. Devin Callahan.

## 2011-01-23 ENCOUNTER — Ambulatory Visit (INDEPENDENT_AMBULATORY_CARE_PROVIDER_SITE_OTHER): Payer: BC Managed Care – PPO | Admitting: Orthopedic Surgery

## 2011-01-23 DIAGNOSIS — M25569 Pain in unspecified knee: Secondary | ICD-10-CM

## 2011-01-23 MED ORDER — OXYCODONE-ACETAMINOPHEN 5-325 MG PO TABS: 1.0000 | ORAL_TABLET | ORAL | Status: DC | PRN

## 2011-01-23 NOTE — Progress Notes (Signed)
POD 7 MUA  S/P L TKA 3.12.12  I OBTAINED 90 FLEXION  HE HAS A LOT OF QUAD INHIBITION BUT WHEN HE RELAXES THE FLEXES   CHANGE MED TO PERCOCET  CONTINUE HOME PT   FOLLOW IN 1 MONTH

## 2011-01-25 ENCOUNTER — Telehealth: Payer: Self-pay | Admitting: *Deleted

## 2011-01-25 NOTE — Telephone Encounter (Signed)
Called wanting to know if it was ok to do therapy 5 days a week and but heat on the knee to help with patients ROM, advised ok

## 2011-01-31 ENCOUNTER — Ambulatory Visit (INDEPENDENT_AMBULATORY_CARE_PROVIDER_SITE_OTHER): Payer: BC Managed Care – PPO | Admitting: Orthopedic Surgery

## 2011-01-31 DIAGNOSIS — M25669 Stiffness of unspecified knee, not elsewhere classified: Secondary | ICD-10-CM

## 2011-01-31 NOTE — Progress Notes (Signed)
0-95 ROM left knee

## 2011-02-05 ENCOUNTER — Ambulatory Visit (HOSPITAL_COMMUNITY)
Admission: RE | Admit: 2011-02-05 | Discharge: 2011-02-05 | Disposition: A | Discharge status: Home / Self Care | Payer: BC Managed Care – PPO | Source: Ambulatory Visit | Attending: Orthopedic Surgery | Admitting: Orthopedic Surgery

## 2011-02-05 DIAGNOSIS — IMO0001 Reserved for inherently not codable concepts without codable children: Secondary | ICD-10-CM | POA: Insufficient documentation

## 2011-02-05 DIAGNOSIS — M25669 Stiffness of unspecified knee, not elsewhere classified: Secondary | ICD-10-CM | POA: Insufficient documentation

## 2011-02-05 DIAGNOSIS — M6281 Muscle weakness (generalized): Secondary | ICD-10-CM | POA: Insufficient documentation

## 2011-02-05 DIAGNOSIS — R262 Difficulty in walking, not elsewhere classified: Secondary | ICD-10-CM | POA: Insufficient documentation

## 2011-02-05 DIAGNOSIS — M25569 Pain in unspecified knee: Secondary | ICD-10-CM | POA: Insufficient documentation

## 2011-02-08 ENCOUNTER — Ambulatory Visit (HOSPITAL_COMMUNITY)
Admission: RE | Admit: 2011-02-08 | Discharge: 2011-02-08 | Disposition: A | Discharge status: Home / Self Care | Payer: BC Managed Care – PPO | Source: Ambulatory Visit | Attending: *Deleted | Admitting: *Deleted

## 2011-02-11 ENCOUNTER — Ambulatory Visit (HOSPITAL_COMMUNITY)
Admission: RE | Admit: 2011-02-11 | Discharge: 2011-02-11 | Disposition: A | Discharge status: Home / Self Care | Payer: BC Managed Care – PPO | Source: Ambulatory Visit | Attending: Family Medicine | Admitting: Family Medicine

## 2011-02-11 NOTE — Progress Notes (Signed)
Physical Therapy Treatment Patient Name: Devin Callahan HYQMV'H Date: 02/11/2011  Time in: 3:58 Time out: 4:33   Subjective: "Not sore just stiff!" 0/10 pain.        Exercise/Treatments @FLOW ([id number])@  Goals PT Short Term Goals Short Term Goal 1: Pt ill improve AROM to 0-105 for improved gait mechanics with ascending and descending stairs. Long Term Goal 1 Progress: Progressing toward goal PT Long Term Goals Long Term Goal 1: Pt will improve strength to 5/5 to allow for proper gait biomechanics and posture with ambulation. Long Term Goal 1 Progress: Progressing toward goal Long Term Goal 2: Pt will demo 5 STS in 25 sec without UE support for improved power. Long Term Goal 2 Progress: Progressing toward goal Long Term Goal 3: Pt will ascend and descend 10 stairs with reciprocal pattern with use of one handrail to safely ambulate stairs in the community. Long Term Goal 3 Progress: Progressing toward goal Long Term Goal 4: Pt will tandem walk on uneven surface x 1000 ft to safely return to yardwork activities. Long Term Goal 4 Progress: Progressing toward goal LTG #5 : Pt will squat and lift 50 lbs from floor to waist in order to complete heavy lifting tasks around the house. LTG #6 Pt will score 22/24 on the Dynamic Gait Index to improve balance and safety.  End of Session Patient Active Problem List  Diagnoses  . OSTEOARTHRITIS, LOWER LEG  . JOINT EFFUSION, KNEE  . HIGH BLOOD PRESSURE  . S/P total knee replacement  . Knee stiffness   PT - End of Session Activity Tolerance: Patient tolerated treatment well  Assessment: Pt completes theres with minimal need for cueing. Continues to  display decreased ROM.  Plan: Continue per PT POC.  Seth Bake Leah 02/11/2011, 5:00 PM

## 2011-02-13 ENCOUNTER — Ambulatory Visit (HOSPITAL_COMMUNITY)
Admission: RE | Admit: 2011-02-13 | Discharge: 2011-02-13 | Disposition: A | Discharge status: Home / Self Care | Payer: BC Managed Care – PPO | Source: Ambulatory Visit | Attending: Family Medicine | Admitting: Family Medicine

## 2011-02-13 NOTE — Progress Notes (Signed)
Physical Therapy Treatment Patient Name: Devin Callahan Date: 02/13/2011  Visit #: 4/4  Time In: 2:50  Time Out: 3:28  Subjective: Pt c/o of increased pain due to weather changes. 5/10 pain in left knee.        Exercise/Treatments For exercise details see doc flowsheets.    Manual Therapy Manual Therapy: Other (comment) Other Manual Therapy: PROM with distraction to increase L kne flex/ext.  Goals PT Short Term Goals Long Term Goal 1 Progress: Progressing toward goal PT Long Term Goals Long Term Goal 1 Progress: Progressing toward goal Long Term Goal 2 Progress: Progressing toward goal Long Term Goal 3 Progress: Progressing toward goal Long Term Goal 4 Progress: Progressing toward goal End of Session Patient Active Problem List  Diagnoses  . OSTEOARTHRITIS, LOWER LEG  . JOINT EFFUSION, KNEE  . HIGH BLOOD PRESSURE  . S/P total knee replacement  . Knee stiffness   PT - End of Session Activity Tolerance: Patient tolerated treatment well PT Assessment and Plan Clinical Impression Statement: Pt presents with antalgic gt and decreased rom for left knee ext/flex. Rehab Potential: Good PT Frequency: Min 2X/week PT Treatment/Interventions: Therapeutic exercise;Other (comment) (manual therapy) PT Plan: Continue with PT POC.   Seth Bake [facility] 02/13/2011, 3:43 PM

## 2011-02-15 ENCOUNTER — Inpatient Hospital Stay (HOSPITAL_COMMUNITY)
Admission: RE | Admit: 2011-02-15 | Payer: BC Managed Care – PPO | Source: Ambulatory Visit | Admitting: Physical Therapy

## 2011-02-18 ENCOUNTER — Ambulatory Visit (HOSPITAL_COMMUNITY)
Admission: RE | Admit: 2011-02-18 | Discharge: 2011-02-18 | Disposition: A | Discharge status: Home / Self Care | Payer: BC Managed Care – PPO | Source: Ambulatory Visit | Attending: Family Medicine | Admitting: Family Medicine

## 2011-02-18 NOTE — Op Note (Signed)
  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  PROCEDURE:  Manipulation under anesthesia, left knee.  SURGEON:  Vickki Hearing, MD.  ANESTHETIC:  Spinal.  OPERATIVE FINDINGS:  Preop passive flexion is 75 degrees, postop is 110 degrees.  HISTORY:  A 65 year old male plateaued at approximately 80 degrees in therapy in terms of his knee flexion.  Extension was not a problem.  We talked about the risks and benefits of continued nonoperative care versus operative care and he decided to go with the manipulation.  After site marking and chart update, the patient was taken to the operating room for spinal anesthetic.  After successful spinal anesthetic, manipulation was performed by flexing the knee and leaning the body weight into the tibial area proximally with a short lever arm until flexion arc improved.  The patient was taken back to recovery room, placed in the CPM machine at 85-120 where he will stay for 23 hours and continue round-the-clock CPM and physical therapy.     Vickki Hearing, M.D.     SEH/MEDQ  D:  01/16/2011  T:  01/17/2011  Job:  [id number]  Electronically Signed by Fuller Canada M.D. on 02/18/2011 05:50:41 PM

## 2011-02-18 NOTE — Progress Notes (Signed)
  NAME:  OTHAL, KUBITZ NO.:  [id number]  MEDICAL RECORD NO.:  [id number]  LOCATION:  A330                          FACILITY:  [facility]  PHYSICIAN:  Vickki Hearing, M.D.DATE OF BIRTH:  June 26, 1946  DATE OF PROCEDURE: DATE OF DISCHARGE:                                PROGRESS NOTE   Postop day #1 status post knee placed under anesthesia of his left total knee replacement.  His CPM is placed at 85-120.  He is tolerating that well.  Today, we will incentive spirometer, Lovenox, and potassium for his hypokalemia.  We will start his physical therapy.  He can decrease his time in CPM to 21 hours that is 7 hours per shift, taking 1-hour break.     Vickki Hearing, M.D.     SEH/MEDQ  D:  01/17/2011  T:  01/17/2011  Job:  [id number]  Electronically Signed by Fuller Canada M.D. on 02/18/2011 05:50:50 PM

## 2011-02-18 NOTE — Discharge Summary (Signed)
  NAME:  Devin Callahan, Devin Callahan NO.:  [id number]  MEDICAL RECORD NO.:  [id number]  LOCATION:  A330                          FACILITY:  [facility]  PHYSICIAN:  Vickki Hearing, M.D.DATE OF BIRTH:  30-Jan-1946  DATE OF ADMISSION:  01/16/2011 DATE OF DISCHARGE:  06/16/2012LH                              DISCHARGE SUMMARY   ADMITTING DIAGNOSIS:  Arthrofibrosis, left knee.  DISCHARGE DIAGNOSIS:  Arthrofibrosis, left knee.  PROCEDURE:  Manipulation under anesthesia, left knee.  SURGICAL FINDINGS:  Preop range of motion was 85 degrees of flexion, postop range of motion 110 degrees of flexion.  HOSPITAL COURSE:  The patient was admitted after his manipulation, placed in a CPM for 23 hours per day, started physical therapy the next day, continued to perform range of motion exercises and continuous CPM administration and improved his flexion to 90 degrees, ambulated over 150 feet, and was discharged home on the 16th afebrile.  MEDICATIONS: 1. Potassium 20 mEq b.i.d. 2. Allopurinol 300 mg 1/2 tablet daily as needed. 3. Amlodipine 10 mg daily. 4. Hydrochlorothiazide 25 mg daily. 5. Hydrocodone 5/325, 1-2 tablets by mouth q.4 h. p.r.n. for pain. 6. Robaxin 500 mg q.6 h. p.r.n. spasm. 7. Omeprazole 20 mg daily at bedtime. 8. Senokot-S tab 1 tablet daily at bedtime as needed. 9. Vytorin 10/40 mg daily at bedtime.  He will have home health, daily physical therapy, CPM machine will be at 85-120 degrees.  He will be in the CPM 6 hours out of every 8 hours over the next 3-4 weeks.  He will follow up with me within 1 week.  DISPOSITION:  Home.  Overall condition is improved.     Vickki Hearing, M.D.     SEH/MEDQ  D:  01/19/2011  T:  01/19/2011  Job:  [id number]  Electronically Signed by Fuller Canada M.D. on 02/18/2011 05:50:46 PM

## 2011-02-18 NOTE — Progress Notes (Addendum)
Physical Therapy Treatment Patient Name: Devin Callahan Date: 02/18/2011  Visit #: 4/4    HPI: Symptoms/Limitations Symptoms: L knee is stiff. Pain Assessment Pain Score:   4 Pain Location: Knee Pain Orientation: Left Pain Frequency: Intermittent     Exercise/Treatments Lumbar Stretches Quad Stretch: 3 reps;30 seconds (with manual distraction) Lumbar Machine Exercises Stationary Bike: 6' @ 2.0 Hip Stretches Quad Stretch: 3 reps;30 seconds (with manual distraction) Hip Exercises Heel Slides: Left;15 reps;AROM;Supine Hamstring Curl: 15 reps;5 seconds (standing) Additional Hip Exercises Stationary Bike: 6' @ 2.0 Knee Stretches Quad Stretch: 3 reps;30 seconds (with manual distraction) Knee: Self-Stretch to increase Flexion: 3 reps;30 seconds (L knee on chair) Knee Exercises Knee Extension: Strengthening;Left;Supine;Other reps (comment) (20 reps; SAQ) Heel Slides: Left;15 reps;AROM;Supine Hamstring Curl: 15 reps;5 seconds (standing) Additional Knee Exercises Functional Squat: 15 reps Balance Exercises Stationary Bike: 6' @ 2.0 Manual Therapy Manual Therapy: Other (comment) Other Manual Therapy: PROM with distraction x10' (each ex only done once; repeated exercise is computer error)  Goals PT Short Term Goals Short Term Goal 1 Progress: Progressing toward goal Short Term Goal 2 Progress: Progressing toward goal PT Long Term Goals Long Term Goal 1 Progress: Progressing toward goal Long Term Goal 2 Progress: Progressing toward goal Long Term Goal 3 Progress: Progressing toward goal Long Term Goal 4 Progress: Progressing toward goal End of Session Patient Active Problem List  Diagnoses  . OSTEOARTHRITIS, LOWER LEG  . JOINT EFFUSION, KNEE  . HIGH BLOOD PRESSURE  . S/P total knee replacement  . Knee stiffness   PT - End of Session Activity Tolerance: Patient tolerated treatment well General Behavior During Session: [facility] for tasks performed Cognition: [facility]  for tasks performed PT Assessment and Plan Clinical Impression Statement: Pt completes stretches without difficulty. Continues to display decreased flex/ext. Rehab Potential: Good PT Treatment/Interventions: Therapeutic exercise;Other (comment) (manual therapy (PROM to increase flex/ext)) PT Plan: Continue with PT POC. Reassess in 2 more visits prior to MD visit.   Seth Bake [facility] 02/18/2011, 3:40 PM

## 2011-02-20 ENCOUNTER — Ambulatory Visit (HOSPITAL_COMMUNITY)
Admission: RE | Admit: 2011-02-20 | Discharge: 2011-02-20 | Disposition: A | Discharge status: Home / Self Care | Payer: BC Managed Care – PPO | Source: Ambulatory Visit | Attending: Family Medicine | Admitting: Family Medicine

## 2011-02-20 DIAGNOSIS — M25669 Stiffness of unspecified knee, not elsewhere classified: Secondary | ICD-10-CM

## 2011-02-20 DIAGNOSIS — Z96659 Presence of unspecified artificial knee joint: Secondary | ICD-10-CM

## 2011-02-20 NOTE — Progress Notes (Signed)
Physical Therapy Treatment Patient Name: SHION BLUESTEIN RUEAV'W Date: 02/20/2011  Time in:  1:50pm Time out:  2:44pm Visits #:  5/5 Charges:  Therex: 30 minutes ; Manual 10 minutes; ice 10 minutes  SUBJECTIVE: Symptoms/Limitations Symptoms: No real pain today, just stiffness L knee. Pain Assessment Currently in Pain?: No/denies Multiple Pain Sites: No  Precautions/Restrictions:  None noted         OBJECTIVE: Exercise/Treatments:  Pt performed the following therex:   ROM Warm-up:   Recumbent Bike 6 minutes ; seat 17   Standing:  Chair Stretch to increase flexion 3X30"      Squats 15X   Supine:  Quad sets 15X      Heelslides 15X   Prone:  Hamstring curls 15X      Quad stretch with manual distraction 3X30"  Modalities Modalities: Cryotherapy Manual Therapy Manual Therapy: Myofascial release Myofascial Release: L lateral/post knee Joint Mobilization: PROM for knee flexion and extension Cryotherapy Number Minutes Cryotherapy: 10 Minutes Cryotherapy Location: Knee Type of Cryotherapy: Ice pack  Goals   End of Session Patient Active Problem List  Diagnoses  . OSTEOARTHRITIS, LOWER LEG  . JOINT EFFUSION, KNEE  . HIGH BLOOD PRESSURE  . S/P total knee replacement  . Knee stiffness   PT - End of Session Activity Tolerance: Patient tolerated treatment well General Behavior During Session: [facility] for tasks performed Cognition: [facility] for tasks performed PT Assessment and Plan Clinical Impression Statement: Improving ROM;  AROM 8-85 degrees in prone; PROM 0-90 degrees in prone. PT Plan: Progress AROM/PROM per POC.   Bascom Levels, Haydyn Liddell B 02/20/2011, 4:36 PM

## 2011-02-22 ENCOUNTER — Ambulatory Visit (HOSPITAL_COMMUNITY)
Admission: RE | Admit: 2011-02-22 | Discharge: 2011-02-22 | Disposition: A | Discharge status: Home / Self Care | Payer: BC Managed Care – PPO | Source: Ambulatory Visit | Attending: Physical Therapy | Admitting: Physical Therapy

## 2011-02-22 DIAGNOSIS — M25669 Stiffness of unspecified knee, not elsewhere classified: Secondary | ICD-10-CM

## 2011-02-22 DIAGNOSIS — Z96659 Presence of unspecified artificial knee joint: Secondary | ICD-10-CM

## 2011-02-22 NOTE — Progress Notes (Signed)
Patient Name: Devin Callahan MRN: [id number] Today's Date: 02/22/2011    Time: 200-249 Charges: 1 MMT, 1 ROM, 25 min TE  Visit #8.  Re-eval complete today.   Exercise:  Bike x10 minutes 3.0 seat 17 STANDING:  Chair Stretch 4x30 sec  Tandem Balance 1 min BLE - w/o UE support  SLS: R and L 4 sec  Stairwell 1 RT: 10.3 to ascend, 10.4 to descend; 1 handrail reciprocal pattern SUPINE:  Active HS stretch 3x30 sec PRONE:   Quad Stretch 3x30 sec.  Manual: Contract Relax to L knee in prone x6        PT Re-evaluation  Diagnosis: L TKR after manipulation  ICD-9 Code: 719.46, 719.56 Referring practitioner: Romeo Apple  Date of next MD visit: 02/26/11 Date of PT re-eval after manipulation: 02/05/11  Patient seen for sessions: 8 visits  Subjective:    Patient's response to therapy: Pt reports his average pain is 3-4/10.  Today 6/10 secondary to the weather.  He is able to sit comfortbally in a low chair for 15 minutes able to walk 45 minutes, stand 20 minutes.  Pt reports he is able to sleep for 3 hours no due to pain.  Pt reports most of his pain in the medial hamstring and has most difficulty getting into his truck.    Objective:   Current condition: Ambulating Independently in home and the community.    MMT  AROM in prone PROM in prone  Hip flexion  5/5 (4/5)      Gluteus Medius  4/5 (4/5)    Gluteus Maximus   5/5 (4/5)    Knee Extension  5/5 (5/5)      Knee Flexion   5/5 (5/5)  0-80 degrees (10-75 degrees)  0-90 degrees (2-78 degrees)  Adductors  5/5 (4/5)         (  )  = Initial Evaluation measurements  Test measurement:  Pt has notable improvement in hamstring length and still has impaired quadriceps length.  POWER: 5 Sit to stands: 30.2 sec w intermittent UE support (34.6 sec w/ UE support), Stairs: ascend reciprocal 10.3 sec, descend reciprocal 10.4 sec both using 1 handrail.  BALANCE: Tandem: R leg posterior 1 minute, L leg posterior:       Assessment:   Summary/analysis of  evaluation: Pt is slowly progressing with AROM.  He has significantly improved his balance and power overall.  He continues to have current body structure impairments including decreased AROM and functional strength.  Pt will continue to benefit from skilled outpatient PT in order to address the above impairments in order to maximize function.   Progress toward previous goals: Cont to progress toward new goals set below.   Plan:   Goals Short-term goals (STG): 2 weeks 1. Pt will demonstrate L knee AROM from 0-105 degrees for improved gait mechanics 2. Pt will demonstrate LLE static single leg stance x30 sec.  3. Pt will report pain less than 2/10 for 50% of his day.   Long-term goals (LTG): 4 weeks 1. Pt will improve knee AROM to [facility] in order to climb into and out of his truck. 2. Pt will increase LE strength to [facility] in order to ambulate and stand for greater than an hour to participate in community activities.   3. Pt will report pain less than or equal to 2/10 for 75% of his day for improved quality of life.  Prognosis to achieve goals: Good  Plan Treatment plan with rationale: To continue with  flexibility and strengthening activities to improve function using strengthening, stretching, manual techniques, NMR, modalities, gait training and patient education.   Recommendations: Cont 2x/wk x4 weeks   Namiyah Grantham 02/22/2011, 3:22 PM

## 2011-02-25 ENCOUNTER — Ambulatory Visit (HOSPITAL_COMMUNITY)
Admission: RE | Admit: 2011-02-25 | Discharge: 2011-02-25 | Disposition: A | Discharge status: Home / Self Care | Payer: BC Managed Care – PPO | Source: Ambulatory Visit | Attending: Family Medicine | Admitting: Family Medicine

## 2011-02-25 NOTE — Progress Notes (Signed)
Physical Therapy Treatment Patient Name: Devin Callahan Date: 02/25/2011  Time In: 2:25 Time Out: 3:10 Visit #: 9 Next Re-eval: 03/25/2011 Charge: therex 30 min Ice: 10 min  Subjective: Symptoms/Limitations Symptoms: "fair" no pain just stiffness Pain Assessment Currently in Pain?: No/denies Pain Score: 0-No pain  Objective:  Exercise/Treatments Recumbent Bike 6 minutes ; seat 16 Standing: Chair Stretch to increase flexion 3X30"  Squats 15X  Supine: Quad sets 20X  Heelslides 20X  Active HS st 3x 30" Prone: Hamstring curls 15X  Quad stretch with manual distraction 3X30" Lumbar Stretches Active Hamstring Stretch: 3 reps;30 seconds Quad Stretch: 3 reps;30 seconds Lumbar Machine Exercises Stationary Bike: 10' 3.0 Seat 16 Hip Stretches Active Hamstring Stretch: 3 reps;30 seconds Quad Stretch: 3 reps;30 seconds Hip Exercises Heel Slides: 20 reps (5" hold ex) Hamstring Curl:  (15 reps) Additional Hip Exercises Stationary Bike: 10' 3.0 Seat 16 Knee Stretches Active Hamstring Stretch: 3 reps;30 seconds Quad Stretch: 3 reps;30 seconds Knee: Self-Stretch to increase Flexion: 3 reps;30 seconds (chair st.) Knee Exercises Heel Slides: 20 reps (5" hold ex) Hamstring Curl:  (15 reps) Additional Knee Exercises Functional Squat: 15 reps Balance Exercises Stationary Bike: 10' 3.0 Seat 16 Modalities Modalities: Cryotherapy Cryotherapy Number Minutes Cryotherapy: 10 Minutes Cryotherapy Location: Knee Type of Cryotherapy: Ice pack  Goals   End of Session Patient Active Problem List  Diagnoses  . OSTEOARTHRITIS, LOWER LEG  . JOINT EFFUSION, KNEE  . HIGH BLOOD PRESSURE  . S/P total knee replacement  . Knee stiffness   PT - End of Session Activity Tolerance: Patient tolerated treatment well General Behavior During Session: [facility] for tasks performed Cognition: [facility] for tasks performed PT Assessment and Plan Clinical Impression Statement: Pt tolerated well towards  total treatment.  Able to ride bike seat 16 PT Plan: continue to increase knee flexion actively and passively.  Juel Burrow 02/25/2011, 5:25 PM

## 2011-02-26 ENCOUNTER — Ambulatory Visit (INDEPENDENT_AMBULATORY_CARE_PROVIDER_SITE_OTHER): Payer: BC Managed Care – PPO | Admitting: Orthopedic Surgery

## 2011-02-26 DIAGNOSIS — M25669 Stiffness of unspecified knee, not elsewhere classified: Secondary | ICD-10-CM

## 2011-02-26 DIAGNOSIS — M1710 Unilateral primary osteoarthritis, unspecified knee: Secondary | ICD-10-CM

## 2011-02-26 DIAGNOSIS — Z96659 Presence of unspecified artificial knee joint: Secondary | ICD-10-CM

## 2011-02-26 NOTE — Progress Notes (Signed)
Physical therapy notes are reviewed. Range of Zero-90, no longer uses assistive device, swelling going down.  Notes reviewed also indicate improvement.  Return one month continue therapy for

## 2011-02-27 ENCOUNTER — Ambulatory Visit (HOSPITAL_COMMUNITY)
Admission: RE | Admit: 2011-02-27 | Discharge: 2011-02-27 | Disposition: A | Discharge status: Home / Self Care | Payer: BC Managed Care – PPO | Source: Ambulatory Visit | Attending: Family Medicine | Admitting: Family Medicine

## 2011-02-27 NOTE — Progress Notes (Signed)
Physical Therapy Treatment Patient Name: Devin Callahan Date: 02/27/2011  Time In: 2:21  Time Out: 3:10  Visit #: 10 Next Re-eval: 03/25/2011  Charge: therex: 30' Manual: 8'   HPI: Symptoms/Limitations Symptoms: Stiff today Pain Assessment Currently in Pain?: No/denies  Objective: Pt achieved 100 degrees of flexion with self stretch.  Exercise/Treatments  Recumbent Bike 10 minutes; seat 16   Standing: Chair Stretch to increase flexion 4X30"  Squats 15X  Tandem stance 1 min BLE without UE support SLS R23 " L8"  Supine: Quad sets 20X  Heelslides 20X  HS st with rope 3x 30"  PROM to increase ext  Prone:  Hamstring curls 15X  Quad stretch 3X30" Contract/relax to increase flexion   Goals PT Short Term Goals Short Term Goal 1 Progress: Progressing toward goal Short Term Goal 2 Progress: Progressing toward goal PT Long Term Goals Long Term Goal 1 Progress: Progressing toward goal Long Term Goal 2 Progress: Progressing toward goal Long Term Goal 3 Progress: Met Long Term Goal 4 Progress: Progressing toward goal End of Session Patient Active Problem List  Diagnoses  . OSTEOARTHRITIS, LOWER LEG  . JOINT EFFUSION, KNEE  . HIGH BLOOD PRESSURE  . S/P total knee replacement  . Knee stiffness   PT - End of Session Activity Tolerance: Patient tolerated treatment well General Behavior During Session: [facility] for tasks performed Cognition: [facility] for tasks performed PT Assessment and Plan Clinical Impression Statement: Pt completes therex w/o difficulty. Pt displays increased tolerance for PROM techniques. PT Treatment/Interventions: Therapeutic exercise;Other (comment) (manual therapy to increase ROM) PT Plan: Continue to progress per PT POC.  Seth Bake [facility] 02/27/2011, 3:16 PM

## 2011-03-01 ENCOUNTER — Ambulatory Visit (HOSPITAL_COMMUNITY)
Admission: RE | Admit: 2011-03-01 | Discharge: 2011-03-01 | Payer: BC Managed Care – PPO | Source: Ambulatory Visit | Attending: Orthopedic Surgery | Admitting: Orthopedic Surgery

## 2011-03-01 DIAGNOSIS — M25669 Stiffness of unspecified knee, not elsewhere classified: Secondary | ICD-10-CM

## 2011-03-01 DIAGNOSIS — Z96659 Presence of unspecified artificial knee joint: Secondary | ICD-10-CM

## 2011-03-01 NOTE — Progress Notes (Addendum)
Physical Therapy Treatment Patient Name: Devin Callahan [id number] Date: 03/01/2011 Time In: 1112 Time Out:  1221 Visit #: 11  Next Re-eval: 03/25/2011  Charge: 9 min manual, 40 min TE  HPI: Symptoms/Limitations Symptoms: I'm a little rough today.  Pt reports increased stiffness to his right knee.  Pt ambulates into therapy with decreased L knee flexion and mild antalgic gait.  OBJECTIVE: AROM in prone: 0-87;  PROM 0-100.  Muscular adhesions present to L semimembraneous insertion.  How long can you sit comfortably?: 5-10 minutes in a regular chair, unlimited in recliner Pain Assessment Currently in Pain?: No/denies (Stiffness)   Exercise/Treatments Warm Up: Stationary Bike: 10 min 4.0 Seat 17 Seated:  5 STS 25.2 sec w/UE support  Cybex Knee Extension: 3 PL w/L ecc lowering x10  Cybex Knee Flexion: 4 PL 10x  Self knee flexion stretch: 3x1 min seated on elevated b Standing:  Stairwell 2 RT w/reciprocal gait, 1 handrail and min cueing for posture  Stairwell 1 RT lateral step ups w/mod cueing for posture  Standing TKE 10x10sec  Wall Squat: 10 reps;10 seconds  Self-Stretch to increase Flexion: 3 reps;30 seconds Prone:  Quad Stretch 3x30 sec  Manual Therapy Manual Therapy: Joint mobilization Prone Myofascial Release: L medial semimembranoseus insertion to decrease pain. x5 min.  Joint Mobilization: Contract/Relax with A-P Grade III-IV L knee mobs to increase flexion x 4 min.  Cryotherapy Number Minutes Cryotherapy: 10 Minutes Type of Cryotherapy: Ice pack  Goals PT Short Term Goals Short Term Goal 1: Pt ill improve AROM to 0-105 for improved gait mechanics with ascending and descending stairs Short Term Goal 1 Progress: Progressing toward goal PT Long Term Goals Long Term Goal 1: Pt will improve strength to 5/5 to allow for proper gait biomechanics and posture with ambulation Long Term Goal 1 Progress: Progressing toward goal Long Term Goal 2: Pt will demo 5 STS in 25 sec without UE  support for improved power Long Term Goal 2 Progress: Partly met (UE assistance) Long Term Goal 3: Pt will ascend and descend 10 stairs with reciprocal pattern with use of one handrail to safely ambulate stairs in the community Long Term Goal 4: Pt will tandem walk on uneven surface x 1000 ft to safely return to yardwork activities End of Session Patient Active Problem List  Diagnoses  . OSTEOARTHRITIS, LOWER LEG  . JOINT EFFUSION, KNEE  . HIGH BLOOD PRESSURE  . S/P total knee replacement  . Knee stiffness   PT - End of Session Activity Tolerance: Patient tolerated treatment well PT Assessment and Plan Clinical Impression Statement: Pt was able to complete stairs with a reciprocal pattern however continues to demonstrate L gluteus maximus weakness and requires cueing for posture.  Pt had decreased muscular adhesions after manual techniques.   PT Plan: Cont to progress and asses new treatments today.   Devin Callahan 03/01/2011, 12:17 PM

## 2011-03-05 ENCOUNTER — Ambulatory Visit (HOSPITAL_COMMUNITY)
Admission: RE | Admit: 2011-03-05 | Discharge: 2011-03-05 | Disposition: A | Discharge status: Home / Self Care | Payer: BC Managed Care – PPO | Source: Ambulatory Visit | Attending: Family Medicine | Admitting: Family Medicine

## 2011-03-05 NOTE — Progress Notes (Signed)
Physical Therapy Treatment  Patient Name: Devin Callahan  [id number] Date: 03/05/2011   Time In: 13:03 Time Out: 14:05  Visit #: 12  Next Re-eval: 03/25/2011  Charge: 12 min manual, 30 min TE , 10 min ice  SUBJECTIVE:  Pt reports no pain/no meds, just stiffness.  OBJECTIVE:  AROM in supine: 0-100   Exercise/Treatments  Warm Up: Stationary Bike: 10 min 4.0 Seat 17  Seated:  5 STS  w/UE support  Cybex Knee Extension: 3 PL w/L ecc lowering 15X  Cybex Knee Flexion: 4 PL 15X  Self knee flexion stretch: 3x1 min seated on elevated bed  Standing:  Squats 20 reps Wall Squat: 10 reps;10 seconds  Self-Stretch to increase Flexion: 3 reps;30 seconds  Prone:  Quad Stretch 3x30 sec   Manual Therapy  Manual Therapy: Joint mobilization distraction and Contract-Relax in prone. Myofascial Release: Medial/Lateral Knee and around scar to decrease adhesions.    Cryotherapy  Number Minutes Cryotherapy: 10 Minutes  Type of Cryotherapy: Ice pack   Goals  PT Short Term Goals  Short Term Goal 1: Pt ill improve AROM to 0-105 for improved gait mechanics with ascending and descending stairs  Short Term Goal 1 Progress: Progressing toward goal  PT Long Term Goals  Long Term Goal 1: Pt will improve strength to 5/5 to allow for proper gait biomechanics and posture with ambulation  Long Term Goal 1 Progress: Progressing toward goal  Long Term Goal 2: Pt will demo 5 STS in 25 sec without UE support for improved power  Long Term Goal 2 Progress: Partly met (UE assistance)  Long Term Goal 3: Pt will ascend and descend 10 stairs with reciprocal pattern with use of one handrail to safely ambulate stairs in the community  Long Term Goal 4: Pt will tandem walk on uneven surface x 1000 ft to safely return to yardwork activities  End of Session  Patient Active Problem List   Diagnoses   .  OSTEOARTHRITIS, LOWER LEG   .  JOINT EFFUSION, KNEE   .  HIGH BLOOD PRESSURE   .  S/P total knee replacement   .  Knee  stiffness   PT - End of Session  Activity Tolerance: Patient tolerated treatment well  PT Assessment and Plan  Clinical Impression Statement: Pt had decreased muscular adhesions after manual techniques.  PT Plan: Cont to progress ROM.   Lurena Nida, PTA 03/05/2011, 15:15PM

## 2011-03-06 ENCOUNTER — Ambulatory Visit (HOSPITAL_COMMUNITY)
Admission: RE | Admit: 2011-03-06 | Discharge: 2011-03-06 | Disposition: A | Discharge status: Home / Self Care | Payer: BC Managed Care – PPO | Source: Ambulatory Visit | Attending: Orthopedic Surgery | Admitting: Orthopedic Surgery

## 2011-03-06 DIAGNOSIS — R262 Difficulty in walking, not elsewhere classified: Secondary | ICD-10-CM | POA: Insufficient documentation

## 2011-03-06 DIAGNOSIS — M25669 Stiffness of unspecified knee, not elsewhere classified: Secondary | ICD-10-CM | POA: Insufficient documentation

## 2011-03-06 DIAGNOSIS — Z96659 Presence of unspecified artificial knee joint: Secondary | ICD-10-CM

## 2011-03-06 DIAGNOSIS — IMO0001 Reserved for inherently not codable concepts without codable children: Secondary | ICD-10-CM | POA: Insufficient documentation

## 2011-03-06 DIAGNOSIS — M6281 Muscle weakness (generalized): Secondary | ICD-10-CM | POA: Insufficient documentation

## 2011-03-06 DIAGNOSIS — M25569 Pain in unspecified knee: Secondary | ICD-10-CM | POA: Insufficient documentation

## 2011-03-06 NOTE — Progress Notes (Signed)
Physical Therapy Treatment Patient Name: Devin Callahan Date: 03/06/2011 Time: 0454-0981 Visit #: 13 Next Re-eval: 03/25/2011  Charge: TE x 38 min, Manual: 10 min, Ice 10 min  HPI: Symptoms/Limitations Symptoms: Pt denies pain.  Pt reports difficulty getting up and down from lowered chairs and getting into and out of his truck.  PROM on chair 90 degrees  Mobility (including Balance)   Antalgic gait with decreased knee flexion noted with entrance.    Exercise/Treatments Warm Up: Stationary Bike: 10 min 4.0 Seat 17  Seated:   5 STS w/ intermittent UE support 26.2 sec -cueing for LLE use.  Cybex Knee Extension: 4 PL w/L ecc lowering 2x10   Cybex Knee Flexion LLE only: 4.5 PL 2x10   Self knee stretch 3x1 min on high-low mat Standing:   Wall Squat: 10 reps;10 seconds   Self-Stretch to increase Flexion: 3 reps;1 minute  Stairwell   Ascend/Descend 2RT   Lateral LLE 1 RT    TKR w/blue band 2x10 Prone:  Quad Stretch 3x30 sec   Modalities Modalities: Cryotherapy Manual Therapy Myofascial Release: Joint mobilization  Joint Mobilization: L medial semimembranoseus insertion to decrease pain.  Other Manual Therapy: Contract/Relax with A-P Grade III-IV L knee mobs to increase flexion Cryotherapy Number Minutes Cryotherapy: 10 Minutes Cryotherapy Location: Knee Weight Bearing Technique Weight Bearing Technique: No  Goals PT Short Term Goals Short Term Goal 1: Pt ill improve AROM to 0-105 for improved gait mechanics with ascending and descending stairs Short Term Goal 1 Progress: Progressing toward goal PT Long Term Goals Long Term Goal 1: Pt will improve strength to 5/5 to allow for proper gait biomechanics and posture with ambulation Long Term Goal 1 Progress: Progressing toward goal Long Term Goal 2: Pt will demo 5 STS in 25 sec without UE support for improved power  Long Term Goal 2 Progress: Progressing toward goal Long Term Goal 3: Pt will ascend and descend 10 stairs  with reciprocal pattern with use of one handrail to safely ambulate stairs in the community  Long Term Goal 3 Progress: Met Long Term Goal 4: Pt will tandem walk on uneven surface x 1000 ft to safely return to yardwork activities  Long Term Goal 4 Progress: Progressing toward goal End of Session Patient Active Problem List  Diagnoses  . OSTEOARTHRITIS, LOWER LEG  . JOINT EFFUSION, KNEE  . HIGH BLOOD PRESSURE  . S/P total knee replacement  . Knee stiffness   PT - End of Session Activity Tolerance: Patient tolerated treatment well PT Assessment and Plan Clinical Impression Statement: Pt has improved ascending stairs with reciprocal pattern with one handrail, but continues to need increased time to descend stairs with one handrail with reciprocal pattern.  Pt continues to ambulate with antalgic gait with decreased knee flexion.  Pt tolerated increased reps and weight today without pain.  PT Plan: Cont to progress and address stairs and functional ROM.  Add standing knee flexion and stool scoots next visit.  Orrie Lascano 03/06/2011, 12:05 PM

## 2011-03-08 ENCOUNTER — Ambulatory Visit (HOSPITAL_COMMUNITY)
Admission: RE | Admit: 2011-03-08 | Discharge: 2011-03-08 | Disposition: A | Discharge status: Home / Self Care | Payer: BC Managed Care – PPO | Source: Ambulatory Visit | Attending: Physical Therapy | Admitting: Physical Therapy

## 2011-03-08 DIAGNOSIS — M25669 Stiffness of unspecified knee, not elsewhere classified: Secondary | ICD-10-CM

## 2011-03-08 DIAGNOSIS — Z96659 Presence of unspecified artificial knee joint: Secondary | ICD-10-CM

## 2011-03-08 NOTE — Progress Notes (Signed)
Physical Therapy Treatment Patient Name: Devin Callahan Date: 03/08/2011 Time: 9518-8416 Visit #: 14 Next Re-eval: 03/25/2011  Charge: TE x 36 min, Manual: 9 min, Ice 10 min  HPI: Symptoms/Limitations Symptoms: Pt reports he is just stiff this morning.  He comes intoday with improved stride length and trunk rotation, with decreased knee flexion.  Exercise/Treatments  Warm Up: Stationary Bike: 10 min 4.0 Seat 17 (seat 15 for 1 minute) Seated:   Cybex Knee Extension 4 PL w/L ecc lowering 3x10   Cybex Knee Flexion LLE only: 4.5 PL 3x10   Stool Scoots 3 RT on carpet Standing:   Self Stretch on Chair 3x1 minute.   TKF 5# 3x10  Tandem walking 1 RT.  PRONE: Joint Mobs w/contract relax to increase knee flexion.  AROM at end of treatment: 0-85 degrees. PROM: 0-93   Goals PT Short Term Goals Short Term Goal 1: Pt ill improve AROM to 0-105 for improved gait mechanics with ascending and descending stairs Short Term Goal 1 Progress: Progressing toward goal PT Long Term Goals Long Term Goal 1: Pt will improve strength to 5/5 to allow for proper gait biomechanics and posture with ambulation Long Term Goal 1 Progress: Progressing toward goal Long Term Goal 2: Pt will demo 5 STS in 25 sec without UE support for improved power  Long Term Goal 3: Pt will ascend and descend 10 stairs with reciprocal pattern with use of one handrail to safely ambulate stairs in the community  Long Term Goal 4: Pt will tandem walk on uneven surface x 1000 ft to safely return to yardwork activities  End of Session Patient Active Problem List  Diagnoses  . OSTEOARTHRITIS, LOWER LEG  . JOINT EFFUSION, KNEE  . HIGH BLOOD PRESSURE  . S/P total knee replacement  . Knee stiffness   PT - End of Session Activity Tolerance: Patient tolerated treatment well PT Assessment and Plan Clinical Impression Statement: Pt was able to demonstrate sit  to stand from a lowered surface without UE suopport.  Overall his functional  strength and ROM is improving and demonstrates less antalgic gait.  Pt was able to tolerate an increase in sets,, but required increased recovery time.  PT Plan: Cont to progress strength and functional ROM.    Oktober Glazer 03/08/2011, 12:03 PM

## 2011-03-11 ENCOUNTER — Ambulatory Visit (HOSPITAL_COMMUNITY)
Admission: RE | Admit: 2011-03-11 | Discharge: 2011-03-11 | Disposition: A | Discharge status: Home / Self Care | Payer: BC Managed Care – PPO | Source: Ambulatory Visit | Attending: Family Medicine | Admitting: Family Medicine

## 2011-03-11 NOTE — Progress Notes (Signed)
Physical Therapy Treatment Patient Name: Devin Callahan'X Date: 03/11/2011  In:  11:04 Out:  12:20 Visit #: 15  Next Re-eval: 03/25/2011  Charge: TE x 35 min, Manual: 10 min, Ice 10 min   SUBJECTIVE: Symptoms/Limitations Symptoms: A little bit of pain/stiffness; No significant difference from last visit. Pain Assessment Currently in Pain?: Yes Pain Score:   1 Pain Location: Knee Pain Orientation: Left Pain Frequency: Intermittent Pain Relieving Factors: Rest, ice, elevation Multiple Pain Sites: No  Precautions/Restrictions :  None noted   OBJECTIVE: Exercise/Treatments Warm Up: Stationary Bike: 10 min 4.0 Seat 17 (seat 15 for 1 minute)  Seated:  Cybex Knee Extension 4 PL w/L ecc lowering 3x10  Cybex Knee Flexion LLE only: 4.5 PL 3x10  Stool Scoots 3 RT on carpet  Standing:  Self Stretch on Chair 3x1 minute.  TKF 5# 3x10 with toe resting on 6" step Tandem walking 1 RT. Stairwell reciprocal 2RT Stairwell side-stepping 2RT  PRONE:  Joint Mobs w/contract relax to increase knee flexion  Manual Therapy Manual Therapy: Joint mobilization Joint Mobilization: MFR/Massage to posterior/anterior knee to decrease adhesions. Other Manual Therapy: contract-relax L knee flexion in prone Cryotherapy Number Minutes Cryotherapy: 10 Minutes Cryotherapy Location: Knee Type of Cryotherapy: Ice pack    End of Session Patient Active Problem List  Diagnoses  . OSTEOARTHRITIS, LOWER LEG  . JOINT EFFUSION, KNEE  . HIGH BLOOD PRESSURE  . S/P total knee replacement  . Knee stiffness   PT - End of Session Activity Tolerance: Patient tolerated treatment well General Behavior During Session: [facility] for tasks performed Cognition: [facility] for tasks performed PT Assessment and Plan Clinical Impression Statement: Pt. tolerated treatment well.  Less recovery time needed today. PT Treatment/Interventions: Therapeutic exercise (manual therapy and ice) PT Plan: Continue to progress functional  strength and ROM  Bascom Levels, Romney Compean B 03/11/2011, 12:22 PM

## 2011-03-13 ENCOUNTER — Ambulatory Visit (HOSPITAL_COMMUNITY)
Admission: RE | Admit: 2011-03-13 | Discharge: 2011-03-13 | Disposition: A | Discharge status: Home / Self Care | Payer: BC Managed Care – PPO | Source: Ambulatory Visit | Attending: Family Medicine | Admitting: Family Medicine

## 2011-03-13 NOTE — Progress Notes (Signed)
Physical Therapy Treatment Patient Name: Devin Callahan Date: 03/13/2011  Time In: 11:09  Time Out: 12:25  Visit #: 16  Next Re-eval: 03/25/2011  Charge: TE x 38 min, Manual: 10 min, Ice 10 min    SUBJECTIVE: Symptoms/Limitations Symptoms: stiffness still, no pain Pain Assessment Currently in Pain?: No/denies  Precautions/Restrictions : None noted   OBJECTIVE:  Exercise/Treatments  Warm Up: Stationary Bike: 10 min 4.0 Seat 1715 Seated:  Cybex Knee Extension 4 PL w/L ecc lowering 3x10  Cybex Knee Flexion LLE only: 4.5 PL 3x10  Stool Scoots 3 RT on carpet  Standing:  Self Stretch on Chair 3x1 minute.  TKF 5# 3x10 with toe resting on 6" step  Tandem walking 1 RT.  Stairwell reciprocal 2RT  Stairwell side-stepping 2RT  PRONE:  Joint Mobs w/contract relax to increase knee flexion   MFR/Massage to posterior/anterior knee to decrease adhesions.      End of Session Patient Active Problem List  Diagnoses  . OSTEOARTHRITIS, LOWER LEG  . JOINT EFFUSION, KNEE  . HIGH BLOOD PRESSURE  . S/P total knee replacement  . Knee stiffness   PT - End of Session Activity Tolerance: Patient tolerated treatment well General Behavior During Session: [facility] for tasks performed Cognition: [facility] for tasks performed PT Assessment and Plan Clinical Impression Statement: continues to make gains in ROM and strength PT Plan: Continue per POC to progress strength and ROM of Left knee  Emeline Gins B 03/13/2011, 11:57 AM

## 2011-03-15 ENCOUNTER — Ambulatory Visit (HOSPITAL_COMMUNITY)
Admission: RE | Admit: 2011-03-15 | Discharge: 2011-03-15 | Disposition: A | Discharge status: Home / Self Care | Payer: BC Managed Care – PPO | Source: Ambulatory Visit

## 2011-03-15 DIAGNOSIS — M25669 Stiffness of unspecified knee, not elsewhere classified: Secondary | ICD-10-CM

## 2011-03-15 DIAGNOSIS — Z96659 Presence of unspecified artificial knee joint: Secondary | ICD-10-CM

## 2011-03-15 NOTE — Progress Notes (Signed)
Physical Therapy Treatment Patient Name: Devin Callahan Date: 03/15/2011  Time In: 11:15 Time Out: 12:25 Visit #: 17 Next Re-eval: 03/25/2011 Charge: therex Manual PROM/STM 18 min  Ice 10 min   Subjective: Symptoms/Limitations Symptoms: no pain just stiffness today Pain Assessment Currently in Pain?: No/denies  Objective:   Exercise/Treatments Warm Up: Stationary Bike: 10 min 4.0 Seat 1715  Seated:  Cybex Knee Extension 4 PL w/L ecc lowering 3x10  Cybex Knee Flexion LLE only: 4.5 PL 3x10  Stool Scoots 3 RT on carpet  Standing:  Self Stretch on Chair 3x1 minute.  TKF 3x10 with toe resting on 6" step  Tandem walking 1 RT.  PRONE:  Joint Mobs/PROM w/contract relax to increase knee flexion  MFR/Massage to posterior/anterior knee to decrease adhesions.  Modalities Modalities: Cryotherapy Manual Therapy Manual Therapy: Other (comment) (Joint mobilization ) Myofascial Release: L medial post/ant knee to decrease pain/adhesions  Joint Mobilization: Massage/PROM  Other Manual Therapy: contract/relax to increase flexion Cryotherapy Number Minutes Cryotherapy: 10 Minutes Cryotherapy Location: Knee Type of Cryotherapy: Ice pack  Goals   End of Session Patient Active Problem List  Diagnoses  . OSTEOARTHRITIS, LOWER LEG  . JOINT EFFUSION, KNEE  . HIGH BLOOD PRESSURE  . S/P total knee replacement  . Knee stiffness   PT - End of Session Activity Tolerance: Patient tolerated treatment well General Behavior During Session: [facility] for tasks performed Cognition: [facility] for tasks performed PT Assessment and Plan Clinical Impression Statement: vc for proper tech with stool scoot, completed without diff following vc.  Pt continues to improve strength, ROM still limited. PT Plan: Trial Biodex PROM next session, continue progressing strength/ROM L knee.  Juel Burrow 03/15/2011, 12:19 PM

## 2011-03-18 ENCOUNTER — Ambulatory Visit (HOSPITAL_COMMUNITY)
Admission: RE | Admit: 2011-03-18 | Discharge: 2011-03-18 | Disposition: A | Discharge status: Home / Self Care | Payer: BC Managed Care – PPO | Source: Ambulatory Visit | Attending: Physical Therapy | Admitting: Physical Therapy

## 2011-03-18 NOTE — Progress Notes (Signed)
Physical Therapy Treatment Patient Name: Devin Callahan Date: 03/18/2011  Time In: 1:03 Time Out: 2:15 Visit #: 18 Next Re-eval: 03/25/2011 Charge: therex 42 min Manual 40 min  Subjective: Symptoms/Limitations Symptoms: no pain today Pain Assessment Currently in Pain?: No/denies  Objective:  Exercise/Treatments  warm Up: Stationary Bike: 10 min 4.0 Seat 1715  Seated:  Cybex Knee Extension 4.5 PL w/L ecc lowering 3x10  Cybex Knee Flexion LLE only: 5 PL 3x10  Biodex PROM attempted/unsuccessful Standing:  Self Stretch on Chair 3x1 minute.  TKF 3x10 with toe resting on 6" step  Tandem walking 1 RT.  PRONE:  Joint Mobs/PROM w/contract relax to increase knee flexion  MFR/Massage to posterior/anterior knee to decrease adhesions. X Modalities  Modalities: Cryotherapy  Manual Therapy  Manual Therapy: Other (comment) (Joint mobilization )  Myofascial Release: L medial post/ant knee to decrease pain/adhesions  Joint Mobilization: Massage/PROM    Goals   End of Session Patient Active Problem List  Diagnoses  . OSTEOARTHRITIS, LOWER LEG  . JOINT EFFUSION, KNEE  . HIGH BLOOD PRESSURE  . S/P total knee replacement  . Knee stiffness   PT - End of Session Activity Tolerance: Patient tolerated treatment well General Behavior During Session: [facility] for tasks performed Cognition: [facility] for tasks performed PT Assessment and Plan Clinical Impression Statement: Attempted biodex/unsuccessful.  Pt tolerated well towards treatment.  Continues to improve strength, able to increase plates on cybex quad and hamstring with same amount reps/sets without diff.  ROM still limited. PT Plan: Continue progress strength/ROM L knee.  Juel Burrow 03/18/2011, 5:30 PM

## 2011-03-20 ENCOUNTER — Ambulatory Visit (HOSPITAL_COMMUNITY)
Admission: RE | Admit: 2011-03-20 | Discharge: 2011-03-20 | Disposition: A | Discharge status: Home / Self Care | Payer: BC Managed Care – PPO | Source: Ambulatory Visit | Attending: *Deleted | Admitting: *Deleted

## 2011-03-20 NOTE — Progress Notes (Addendum)
Physical Therapy Treatment Patient Name: ALI MOHL ZOXWR'U Date: 03/20/2011  Time In: 1:11  Time Out: 2:26 Visit #: 19 Next Re-eval: 03/25/2011  Charge: therex 30' Manual  15' Ice x 1 unit   HPI: Symptoms/Limitations Symptoms: Pain's okay since I took a pain pill. Pain Assessment Currently in Pain?: No/denies  Objective: PROM : 0-109 L knee   Exercise/Treatments Warm Up:  Stationary Bike: 10 min 4.0 Seat 17@15    Seated:  Cybex Knee Extension 4.5 PL w/L ecc lowering 3x10  Cybex Knee Flexion LLE only: 5.5 PL 3x10  Stool scoot 3RT on carpet   Standing:  Self Stretch on Chair 3x1 minute.  TKF 3x10 with toe resting on 6" step    PRONE:  Joint Mobs/PROM w/contract relax to increase knee flexion   Modalities Modalities: Cryotherapy Manual Therapy Manual Therapy: Other (comment) (PROM and contract relax to increase ROM) Myofascial Release: L ITB to decrease adhesions x10' Other Manual Therapy: PROM and contract relax to increase ROM x5' Cryotherapy Number Minutes Cryotherapy: 8 Minutes Cryotherapy Location: Knee;Other (comment) (Left) Type of Cryotherapy: Ice pack  Goals PT Short Term Goals Short Term Goal 1: Pt ill improve AROM to 0-105 for improved gait mechanics with ascending and descending stairs Short Term Goal 1 Progress: Progressing toward goal Short Term Goal 2 Progress: Progressing toward goal PT Long Term Goals Long Term Goal 1: Pt will improve strength to 5/5 to allow for proper gait biomechanics and posture with ambulation Long Term Goal 1 Progress: Progressing toward goal Long Term Goal 2: Pt will demo 5 STS in 25 sec without UE support for improved power  Long Term Goal 2 Progress: Progressing toward goal Long Term Goal 3: Pt will ascend and descend 10 stairs with reciprocal pattern with use of one handrail to safely ambulate stairs in the community  Long Term Goal 3 Progress: Met Long Term Goal 4: Pt will tandem walk on uneven surface x 1000 ft  to safely return to yardwork activities  Long Term Goal 4 Progress: Progressing toward goal End of Session Patient Active Problem List  Diagnoses  . OSTEOARTHRITIS, LOWER LEG  . JOINT EFFUSION, KNEE  . HIGH BLOOD PRESSURE  . S/P total knee replacement  . Knee stiffness   PT - End of Session Activity Tolerance: Patient tolerated treatment well General Behavior During Session: [facility] for tasks performed Cognition: [facility] for tasks performed PT Assessment and Plan Clinical Impression Statement: Pt completes therex with minimal difficulty. Pt continues to display ROM gains. PT Treatment/Interventions: Therapeutic exercise;Other (comment) (manual therapy x 15'  ice x 8') PT Plan: Continue to progress per PT POC.  Seth Bake [facility] 03/20/2011, 2:26 PM

## 2011-03-22 ENCOUNTER — Ambulatory Visit (HOSPITAL_COMMUNITY)
Admission: RE | Admit: 2011-03-22 | Discharge: 2011-03-22 | Disposition: A | Discharge status: Home / Self Care | Payer: BC Managed Care – PPO | Source: Ambulatory Visit | Attending: Family Medicine | Admitting: Family Medicine

## 2011-03-22 NOTE — Progress Notes (Signed)
Physical Therapy Treatment Patient Details  Name: Devin Callahan MRN: [id number] Date of Birth: 20-Oct-1945  Today's Date: 03/22/2011 Time: 1305-1401 Time Calculation (min): 56 min Visit#: 20 Re-eval: 03/25/11 Charges: Therex 25' ;  Manual therapy 5' ; Ice x 10'  Subjective: Symptoms/Limitations Symptoms: It's really stiff today. Pain Assessment Currently in Pain?: Yes Pain Score:   3 Pain Location: Knee Pain Orientation: Left   Exercise/Treatments  Warm Up:  Stationary Bike: 10 min 4.0 Seat @15    Seated:  Cybex Knee Extension 4.5 PL w/L ecc lowering 3x10   Standing:  Self Stretch on Chair 4x1 minute.  TKF 2x15 with toe resting on 6" step  Hip flexion w/3# wt L only 10 reps  Prone:  PROM to increase flexion (L knee)  Supine: PROM to increase ext (L knee)  Modalities Modalities: Cryotherapy Manual Therapy Manual Therapy: Other (comment) (PROM and contract relax to increase ROM) Other Manual Therapy: Prom to increase ROM x 5' Cryotherapy Number Minutes Cryotherapy: 10 Minutes Cryotherapy Location: Knee;Other (comment) (Left) Type of Cryotherapy: Ice pack  Physical Therapy Assessment and Plan PT Assessment and Plan Clinical Impression Statement: Pt tolerates increases in intensity of therex without difficulty. Pt with decreased ROM today secondary to stiffness. PT Treatment/Interventions: Therapeutic exercise;Other (comment) (manual therapy x 5' ice x 10')    Goals PT Short Term Goals PT Short Term Goal 1: Pt ill improve AROM to 0-105 for improved gait mechanics with ascending and descending stairs PT Short Term Goal 1 - Progress: Progressing toward goal PT Long Term Goals PT Long Term Goal 1: Pt will improve strength to 5/5 to allow for proper gait biomechanics and posture with ambulation PT Long Term Goal 1 - Progress: Progressing toward goal PT Long Term Goal 2: Pt will demo 5 STS in 25 sec without UE support for improved power  PT Long Term Goal 2 -  Progress: Progressing toward goal Long Term Goal 3: Pt will ascend and descend 10 stairs with reciprocal pattern with use of one handrail to safely ambulate stairs in the community  Long Term Goal 3 Progress: Met Long Term Goal 4: Pt will tandem walk on uneven surface x 1000 ft to safely return to yardwork activities  Long Term Goal 4 Progress: Progressing toward goal  Problem List Patient Active Problem List  Diagnoses  . OSTEOARTHRITIS, LOWER LEG  . JOINT EFFUSION, KNEE  . HIGH BLOOD PRESSURE  . S/P total knee replacement  . Knee stiffness    PT - End of Session Activity Tolerance: Patient tolerated treatment well General Behavior During Session: [facility] for tasks performed Cognition: [facility] for tasks performed  Antonieta Iba 03/22/2011, 4:57 PM

## 2011-03-25 ENCOUNTER — Ambulatory Visit (HOSPITAL_COMMUNITY)
Admission: RE | Admit: 2011-03-25 | Discharge: 2011-03-25 | Disposition: A | Discharge status: Home / Self Care | Payer: BC Managed Care – PPO | Source: Ambulatory Visit | Attending: Family Medicine | Admitting: Family Medicine

## 2011-03-25 NOTE — Progress Notes (Signed)
Physical Therapy Treatment Patient Details  Name: Devin Callahan MRN: [id number] Date of Birth: 24-Oct-1945  Today's Date: 03/25/2011 Time: 4098-1191 Time Calculation (min): 64 min Visit#: 21 Re-eval: 03/25/11 Charges: Therex x 35' ; ROM measurement x 1 unit; Ice x 1 unit  Subjective: Symptoms/Limitations Symptoms: "Real sitff this morning because it's cloudy outside." Pain Assessment Currently in Pain?: No/denies  Objective: L knee AROM 2-100; L knee PROM 0-115; Pt completed 5 sit - stands w/o UE assist in 20"  Exercise/Treatments  Warm Up:  Stationary Bike: 10 min 4.0 Seat 17@15    Seated:  Cybex Knee Extension 4.5 PL w/L ecc lowering 3x10  Sit to stand x 5 (in 20")  Standing:  Self Stretch on Chair 4x1 minute.  TKF 2x15 with toe resting on 6" step  Tandem gt 1 RT on foam beam  PRONE:  PROM  to increase knee flexion   Modalities Modalities: Cryotherapy Manual Therapy Manual Therapy: Other (comment) Other Manual Therapy: PROM to increase flexion Cryotherapy Number Minutes Cryotherapy: 10 Minutes Cryotherapy Location: Knee (Left) Type of Cryotherapy: Ice pack  Physical Therapy Assessment and Plan PT Assessment and Plan Clinical Impression Statement: Pt completes therex without difficulty.  Pt continues to display gains in ROM. Pt able to move seat to 14 on red bike this tx. Pt with multiple LOB with tandem gt on foam. PT Treatment/Interventions: Therapeutic exercise;Other (comment) (PROM and Ice pack) PT Plan: Continue per PT POC.    Goals PT Short Term Goals PT Short Term Goal 1 - Progress: Met PT Long Term Goals PT Long Term Goal 1 - Progress: Partly met PT Long Term Goal 2 - Progress: Met Long Term Goal 3 Progress: Met Long Term Goal 4 Progress: Not met  Problem List Patient Active Problem List  Diagnoses  . OSTEOARTHRITIS, LOWER LEG  . JOINT EFFUSION, KNEE  . HIGH BLOOD PRESSURE  . S/P total knee replacement  . Knee stiffness    PT - End of  Session Activity Tolerance: Patient tolerated treatment well General Behavior During Session: [facility] for tasks performed Cognition: [facility] for tasks performed  Antonieta Iba 03/25/2011, 4:19 PM

## 2011-03-26 ENCOUNTER — Encounter (HOSPITAL_COMMUNITY): Payer: Self-pay | Admitting: Physical Therapy

## 2011-03-27 ENCOUNTER — Ambulatory Visit (HOSPITAL_COMMUNITY)
Admission: RE | Admit: 2011-03-27 | Discharge: 2011-03-27 | Disposition: A | Discharge status: Home / Self Care | Payer: BC Managed Care – PPO | Source: Ambulatory Visit | Attending: Family Medicine | Admitting: Family Medicine

## 2011-03-27 DIAGNOSIS — M25669 Stiffness of unspecified knee, not elsewhere classified: Secondary | ICD-10-CM

## 2011-03-27 DIAGNOSIS — Z96659 Presence of unspecified artificial knee joint: Secondary | ICD-10-CM

## 2011-03-27 NOTE — Progress Notes (Signed)
Physical Therapy Treatment Patient Details  Name: Devin Callahan MRN: [id number] Date of Birth: Aug 29, 1945  Today's Date: 03/27/2011 Time: 4098-1191 Time Calculation (min): 47 min Visit#: 22 Re-eval: 04/08/11 Charge: Therex: Manual 7 min  Subjective: Symptoms/Limitations Symptoms: Real stiff today, little pain not bad maybe a 3/10. Pain Assessment Currently in Pain?: Yes Pain Score:   3 Pain Location: Knee Pain Orientation: Left  Objective:  Exercise/Treatments Warm Up:  Stationary Bike: 10 min 4.0 Seat 17@15   Seated:  Sit to stand x 5 (in 22")  with L LE back Standing:  Self Stretch on Chair 4x1 minute.  TKF 2x15 with toe resting on 6" step  Wall squats 10x 10" Stairs 2RT Cone step with foot tab on cone for increased knee flexion 2RT Side step over cones 1RT PRONE:  Contract/Relax/PROM to increase knee flexion 4x 30"  Manual Therapy Manual Therapy: Other (comment) (Contract/Relax L knee to increase flexion) Other Manual Therapy: Contract/Relax/ PROM 4x 30"  Physical Therapy Assessment and Plan PT Assessment and Plan Clinical Impression Statement: Advanced therex focusing on ROM.  Pt tolerated well towards all therex with some LOB presented with high marching/foot tapping over cones, able to regain balance independently without assistance required.  Ice at home today. PT Plan: Continue POC progressing ROM.    Goals    Problem List Patient Active Problem List  Diagnoses  . OSTEOARTHRITIS, LOWER LEG  . JOINT EFFUSION, KNEE  . HIGH BLOOD PRESSURE  . S/P total knee replacement  . Knee stiffness    PT - End of Session Activity Tolerance: Patient tolerated treatment well General Behavior During Session: [facility] for tasks performed Cognition: [facility] for tasks performed  Juel Burrow 03/27/2011, 2:20 PM

## 2011-03-29 ENCOUNTER — Ambulatory Visit (HOSPITAL_COMMUNITY)
Admission: RE | Admit: 2011-03-29 | Discharge: 2011-03-29 | Disposition: A | Discharge status: Home / Self Care | Payer: BC Managed Care – PPO | Source: Ambulatory Visit | Attending: Family Medicine | Admitting: Family Medicine

## 2011-03-29 DIAGNOSIS — M25669 Stiffness of unspecified knee, not elsewhere classified: Secondary | ICD-10-CM

## 2011-03-29 DIAGNOSIS — Z96659 Presence of unspecified artificial knee joint: Secondary | ICD-10-CM

## 2011-03-29 NOTE — Progress Notes (Signed)
Physical Therapy Treatment Patient Details  Name: Devin Callahan MRN: [id number] Date of Birth: 04/24/1946  Today's Date: 03/29/2011 Time: 4098-1191 Time Calculation (min): 50 min Visit#: 1:05 of 1:55 Re-eval: 04/08/11 Charge: therex 30 min Manual 10 min  Subjective: Symptoms/Limitations Symptoms: No real complaints, stiff knee today, maybe a 3/10 pain scale Pain Assessment Currently in Pain?: Yes Pain Score:   3 Pain Location: Knee Pain Orientation: Left  Precautions/Restrictions     Mobility (including Balance)       Exercise/Treatments Warm Up:  Stationary Bike: 10 min 4.0 Seat 17--154 Seated:  Sit to stand x 5 (in 22") with L LE back  Standing:  Self Stretch on Chair 4x1 minute.  TKF 2x15 with toe resting on 6" step  Wall squats 10x 10"  Cone step with foot tab on cone for increased knee flexion 2RT  Side step over cones 1RT  Supine:  Heel slides 15 x PRONE:  Contract/Relax/PROM to increase knee flexion 4x 30"   Physical Therapy Assessment and Plan PT Assessment and Plan Clinical Impression Statement: Pt tolerated well towards total therex.  Pain reduced at end of session following manual/ice. PT Plan: Continue progressing ROM/balance.    Goals    Problem List Patient Active Problem List  Diagnoses  . OSTEOARTHRITIS, LOWER LEG  . JOINT EFFUSION, KNEE  . HIGH BLOOD PRESSURE  . S/P total knee replacement  . Knee stiffness    PT - End of Session Activity Tolerance: Patient tolerated treatment well General Behavior During Session: [facility] for tasks performed Cognition: [facility] for tasks performed  Juel Burrow 03/29/2011, 5:31 PM

## 2011-04-01 ENCOUNTER — Ambulatory Visit (HOSPITAL_COMMUNITY)
Admission: RE | Admit: 2011-04-01 | Discharge: 2011-04-01 | Disposition: A | Discharge status: Home / Self Care | Payer: BC Managed Care – PPO | Source: Ambulatory Visit | Attending: Family Medicine | Admitting: Family Medicine

## 2011-04-01 NOTE — Progress Notes (Addendum)
Physical Therapy Treatment Patient Details  Name: Devin Callahan MRN: [id number] Date of Birth: Dec 30, 1945  Today's Date: 04/01/2011 Time: 9147-8295 Time Calculation (min): 49 min Visit#: 24 Re-eval: 04/08/11 Charges: Therex x 25'; Ice x 1 unit; Manual x 5'  Subjective: Symptoms/Limitations Symptoms: I think it's loosening up some. Pain Assessment Currently in Pain?: No/denies   Exercise/Treatments Warm Up:  Stationary Bike: 80 min 4.0 Seat 15  Seated:  Sit to stand x 5 (in 24") with L LE back  Standing:  Self Stretch on Chair 4x1 minute. (HEP) TKF 2x15 with toe resting on 6" step  Wall squats 10x 10"  Cone step with foot tab on cone for increased knee flexion 2RT  Side step over cones 2RT  PRONE:  Contract/Relax/PROM to increase knee flexion  Modalities Modalities: Cryotherapy Manual Therapy Other Manual Therapy: Contract/Relax/ PROM  Cryotherapy Number Minutes Cryotherapy: 10 Minutes Cryotherapy Location: Knee (Left) Type of Cryotherapy: Ice pack  Physical Therapy Assessment and Plan PT Assessment and Plan Clinical Impression Statement: Pt continues to show gains in ROM. Progress note completed prior to MD appt. PT Treatment/Interventions: Therapeutic exercise;Other (comment) (PROM and Ice) PT Plan: Continue per PT POC.     Problem List Patient Active Problem List  Diagnoses  . OSTEOARTHRITIS, LOWER LEG  . JOINT EFFUSION, KNEE  . HIGH BLOOD PRESSURE  . S/P total knee replacement  . Knee stiffness    PT - End of Session Activity Tolerance: Patient tolerated treatment well General Behavior During Session: [facility] for tasks performed Cognition: [facility] for tasks performed  Antonieta Iba 04/01/2011, 5:36 PM

## 2011-04-01 NOTE — Patient Instructions (Addendum)
Conti

## 2011-04-02 ENCOUNTER — Ambulatory Visit (INDEPENDENT_AMBULATORY_CARE_PROVIDER_SITE_OTHER): Payer: BC Managed Care – PPO | Admitting: Orthopedic Surgery

## 2011-04-02 DIAGNOSIS — M25669 Stiffness of unspecified knee, not elsewhere classified: Secondary | ICD-10-CM

## 2011-04-02 DIAGNOSIS — Z96659 Presence of unspecified artificial knee joint: Secondary | ICD-10-CM

## 2011-04-02 NOTE — Progress Notes (Signed)
June 13 manipulation under anesthesia LEFT knee status post RIGHT and LEFT total knee replacements Range of Motion is now 100Active and 115 passive  Pain is controlled well with combination of hydrocodone for mild pain and Percocet for breakthrough pain.  Continue therapy followup one month

## 2011-04-02 NOTE — Patient Instructions (Signed)
Continue your rehab exercises  

## 2011-04-03 ENCOUNTER — Ambulatory Visit (HOSPITAL_COMMUNITY)
Admission: RE | Admit: 2011-04-03 | Discharge: 2011-04-03 | Disposition: A | Discharge status: Home / Self Care | Payer: BC Managed Care – PPO | Source: Ambulatory Visit | Attending: Family Medicine | Admitting: Family Medicine

## 2011-04-03 DIAGNOSIS — M25669 Stiffness of unspecified knee, not elsewhere classified: Secondary | ICD-10-CM

## 2011-04-03 DIAGNOSIS — Z96659 Presence of unspecified artificial knee joint: Secondary | ICD-10-CM

## 2011-04-03 NOTE — Progress Notes (Signed)
Physical Therapy Treatment Patient Details  Name: Devin Callahan MRN: [id number] Date of Birth: 29-Jul-1946  Today's Date: 04/03/2011 Time: 4098-1191 Time Calculation (min): 53 min Visit#: 25 Re-eval: 04/08/11  Subjective: Symptoms/Limitations Symptoms: Pt reports his MD apt. went well and MD is happy with the progress.  Pt repors he wishes it was not this slow and is still a little stiff and sore.  Pain Assessment Pain Score:   1 Pain Location: Knee Pain Orientation: Left  Manual Therapy Joint Mobilization: PRONE: knee flexion w/contract relax 10xExercise/Treatments  Warm Up:   Stationary Bike: 10 min 4.0 Seat 15  Standing:   Self Stretch on Chair 4x1 minute. (HEP)   TKF 2x15 with toe resting on 6" step   Lateral Step Ups 2x10 4 in.  Step Downs x10 4 in.  Step Ups x10 4 in.  Walking Lunges 1 RT   Physical Therapy Assessment and Plan PT Assessment and Plan Clinical Impression Statement: Pt continues to progress in functional strength, however continues to have limitations in knee flexion with walking and stair climbing.   PT Plan: Cont to progress.  Re-eval next visit    Goals    Problem List Patient Active Problem List  Diagnoses  . OSTEOARTHRITIS, LOWER LEG  . JOINT EFFUSION, KNEE  . HIGH BLOOD PRESSURE  . S/P total knee replacement  . Knee stiffness    PT - End of Session Activity Tolerance: Patient tolerated treatment well  Devin Callahan 04/03/2011, 1:53 PM

## 2011-04-05 ENCOUNTER — Ambulatory Visit (HOSPITAL_COMMUNITY)
Admission: RE | Admit: 2011-04-05 | Discharge: 2011-04-05 | Disposition: A | Discharge status: Home / Self Care | Payer: BC Managed Care – PPO | Source: Ambulatory Visit | Attending: Physical Therapy | Admitting: Physical Therapy

## 2011-04-05 DIAGNOSIS — M25669 Stiffness of unspecified knee, not elsewhere classified: Secondary | ICD-10-CM

## 2011-04-05 DIAGNOSIS — Z96659 Presence of unspecified artificial knee joint: Secondary | ICD-10-CM

## 2011-04-05 NOTE — Progress Notes (Signed)
Physical Therapy Treatment Patient Details  Name: Devin Callahan MRN: [id number] Date of Birth: 03-26-1946  Today's Date: 04/05/2011 Time: 4098-1191 Time Calculation (min): 53 min Charges: 28' TE, 5' Man Visit#: 26 Re-eval: 04/19/11  Subjective: Symptoms/Limitations Symptoms: No pain just some stiffness today. I am still having some problems going up and down my stairs since they are a little taller then the stairs you have here.   Precautions/Restrictions     Mobility (including Balance)       Exercise/Treatments Warm Up:  Stationary Bike: 10 min 4.0 Seat 15  Standing:   Self Stretch on Chair 4x1 minute.  Sit to Stand 2x5 reps  TKF 2x15 with toe resting on 6" step   Lateral Step Ups x10 8 in.   Step Ups x10 8 in.   Walking Lunges 1 RT  Heel Walks 2 RT  Toe Walks 2RT PRONE Manual:  Grade III-IV knee mobs Prone: contract/relax x5 10 Minutes Ice to L knee    Physical Therapy Assessment and Plan PT Assessment and Plan Clinical Impression Statement: Pt has difficulty with functional knee flexion and continues to require max cueing for posture with upper level ther-ex.  PT Plan: Cont to progress. Re-eval next visit     Goals    Problem List Patient Active Problem List  Diagnoses  . OSTEOARTHRITIS, LOWER LEG  . JOINT EFFUSION, KNEE  . HIGH BLOOD PRESSURE  . S/P total knee replacement  . Knee stiffness    PT - End of Session Activity Tolerance: Patient tolerated treatment well  Jerie Basford 04/05/2011, 2:38 PM

## 2011-04-10 ENCOUNTER — Ambulatory Visit (HOSPITAL_COMMUNITY)
Admission: RE | Admit: 2011-04-10 | Discharge: 2011-04-10 | Disposition: A | Discharge status: Home / Self Care | Payer: BC Managed Care – PPO | Source: Ambulatory Visit | Attending: Orthopedic Surgery | Admitting: Orthopedic Surgery

## 2011-04-10 DIAGNOSIS — M25569 Pain in unspecified knee: Secondary | ICD-10-CM | POA: Insufficient documentation

## 2011-04-10 DIAGNOSIS — R262 Difficulty in walking, not elsewhere classified: Secondary | ICD-10-CM | POA: Insufficient documentation

## 2011-04-10 DIAGNOSIS — Z96659 Presence of unspecified artificial knee joint: Secondary | ICD-10-CM

## 2011-04-10 DIAGNOSIS — M6281 Muscle weakness (generalized): Secondary | ICD-10-CM | POA: Insufficient documentation

## 2011-04-10 DIAGNOSIS — M25669 Stiffness of unspecified knee, not elsewhere classified: Secondary | ICD-10-CM | POA: Insufficient documentation

## 2011-04-10 DIAGNOSIS — IMO0001 Reserved for inherently not codable concepts without codable children: Secondary | ICD-10-CM | POA: Insufficient documentation

## 2011-04-10 NOTE — Progress Notes (Signed)
Physical Therapy Treatment Patient Details  Name: WALTON DIGILIO MRN: [id number] Date of Birth: [birth year]  Today's Date: 04/10/2011 Time: 8295-6213 Time Calculation (min): 70 min Visit#: 27 Re-eval: 04/19/2011 prior MD appt Charge: extremity ROM Measurement 1 unit Therex 38 min  Ice x 10 min  Subjective: Symptoms/Limitations Symptoms: I have pain today, 5/10 L knee.  Haven't done anything out of the ordinary just exercised. Pain Assessment Currently in Pain?: Yes Pain Score:   5 Pain Location: Knee Pain Orientation: Left  Objective: AROM Prone 0-95 degrees PROM Prone 0-110 degress Supine AROM 0-93 degrees Supine PROM  0-108 degrees  Exercise/Treatments Reciprocal bike x 10 min @ 4.0 seat 17 15 STANDING: Self st flexion on chair 4x 1 min Tandem stance 2x 30" Tandem gait x 1 RT on line Tandem gait on ramp  Tandem gait on grass (uneven surfaces) TKF with 8 in box 3 sets/10 reps Functional squats 10 reps PRONE: Contract relax x 10 PROM 3X30" flexion TKF 10x 10" SUPINE: Heel slides with terminal knee flexion 10 x PROM 3x 30" Ice x 10 min     Modalities Modalities: Cryotherapy Cryotherapy Number Minutes Cryotherapy: 10 Minutes Cryotherapy Location: Knee Type of Cryotherapy: Ice pack  Physical Therapy Assessment and Plan PT Assessment and Plan Clinical Impression Statement: Re-eval complete.  Pt with decreased ROM compared to last time assessed but has increased pain and states ROM decreased due to weather conditions.  Pt stated  at MD appt on 04/02/2011 MD told pt to continue PT for 4 more weeks.   PT Plan: Continue PT to progress ROM and balance.    Goals PT Short Term Goals PT Short Term Goal 1 - Progress: Progressing toward goal PT Long Term Goals PT Long Term Goal 1 - Progress: Met PT Long Term Goal 2 - Progress: Met Long Term Goal 3 Progress: Met Long Term Goal 4 Progress: Progressing toward goal  Problem List Patient Active Problem List  Diagnoses    . OSTEOARTHRITIS, LOWER LEG  . JOINT EFFUSION, KNEE  . HIGH BLOOD PRESSURE  . S/P total knee replacement  . Knee stiffness    PT - End of Session Activity Tolerance: Patient tolerated treatment well General Behavior During Session: [facility] for tasks performed Cognition: [facility] for tasks performed  Juel Burrow 04/10/2011, 3:49 PM

## 2011-04-12 ENCOUNTER — Ambulatory Visit (HOSPITAL_COMMUNITY)
Admission: RE | Admit: 2011-04-12 | Discharge: 2011-04-12 | Disposition: A | Discharge status: Home / Self Care | Payer: BC Managed Care – PPO | Source: Ambulatory Visit

## 2011-04-12 DIAGNOSIS — Z96659 Presence of unspecified artificial knee joint: Secondary | ICD-10-CM

## 2011-04-12 DIAGNOSIS — M25669 Stiffness of unspecified knee, not elsewhere classified: Secondary | ICD-10-CM

## 2011-04-12 NOTE — Progress Notes (Signed)
Physical Therapy Treatment Patient Details  Name: Devin Callahan MRN: [id number] Date of Birth: [birth year]  Today's Date: 04/12/2011 Time: 1302-1400 Time Calculation (min): 58 min Visit#: 1 of 8 since eval total # 28 Re-eval: 05/08/11 Charge: manual 40 min therex 8 min MHP 1 unit  Subjective: Symptoms/Limitations Symptoms: No pain just really stiff today.  Objective:   Exercise/Treatments Reciprocal bike x 10 min @ 4.0 seat 17 14  PRONE:  Contract relax x 10  PROM 3X30" flexion Grade III and IV to increase joint mobe TKF 10x 10" 3 sets SUPINE: complete with MHP on knee for trial of increased flexion Heel slides with terminal knee flexion 10 x with MHP PROM 3x 30"  SITTING: Self st flexion on mat at lowest height 3x 60" STANDING:  Self st flexion on chair 4x 1 min  Modalities Modalities: Moist Heat Manual Therapy Joint Mobilization: Grade III-IV knee mobs following MHP for increased knee flexion Other Manual Therapy: Prone contract/relax Moist Heat Therapy Number Minutes Moist Heat: 20 Minutes Moist Heat Location: Other (comment) (knee)  Physical Therapy Assessment and Plan PT Assessment and Plan Clinical Impression Statement: Trial with MHP for increased knee flexion during session.  Manual tx whole session with PROM and terminal knee flexion both supine and prone, joint mobs completed by Annett Fabian, PT before active stretching.  PROM to 107 degrees both supine and prone.   PT Plan: Continue to progress ROM    Goals    Problem List Patient Active Problem List  Diagnoses  . OSTEOARTHRITIS, LOWER LEG  . JOINT EFFUSION, KNEE  . HIGH BLOOD PRESSURE  . S/P total knee replacement  . Knee stiffness    General Behavior During Session: [facility] for tasks performed Cognition: [facility] for tasks performed  Juel Burrow 04/12/2011, 6:18 PM

## 2011-04-15 ENCOUNTER — Ambulatory Visit (HOSPITAL_COMMUNITY)
Admission: RE | Admit: 2011-04-15 | Discharge: 2011-04-15 | Disposition: A | Discharge status: Home / Self Care | Payer: BC Managed Care – PPO | Source: Ambulatory Visit | Attending: Physical Therapy | Admitting: Physical Therapy

## 2011-04-15 NOTE — Progress Notes (Addendum)
Physical Therapy Treatment Patient Details  Name: Devin Callahan MRN: [id number] Date of Birth: 12/15/1945  Today's Date: 04/15/2011 Time: 1303-1410 Time Calculation (min): 67 min Visit#: 2 of 8 (29 total) Re-eval: 05/08/11 Charges:  therex 18', manual 18', ice 10'  Objective:   L knee flexion:  AROM 107 degrees, PROM 113 degrees at end of session.  Subjective: Symptoms/Limitations Symptoms: Continued stiffness but no pain. Pain Assessment Currently in Pain?: No/denies   Exercise/Treatments Exercise/Treatments  Reciprocal bike x 10 min @ 4.0 seat 12  PRONE:  Contract relax 5x  TKF 20x 5"  SUPINE:  Heel slides with terminal knee flexion 10 x  PROM 3x 30"  SITTING:  Self st flexion on mat at lowest height 3x 60"  STANDING:  Self st flexion on chair 4x 1 min   Manual Therapy Myofascial Release: L knee to decrease adhesions Other Manual Therapy: prone contract relax to increase flexion. Cryotherapy Number Minutes Cryotherapy: 10 Minutes Cryotherapy Location: Knee Type of Cryotherapy: Ice pack  Physical Therapy Assessment and Plan PT Assessment and Plan Clinical Impression Statement: able to increase seat position to 12 today; no difference reported using heat on knee for ROM.  OVerall improved A/PROM today. PT Treatment/Interventions: Therapeutic exercise (manual, PROM and ice) PT Plan: Continue per POC.    Problem List Patient Active Problem List  Diagnoses  . OSTEOARTHRITIS, LOWER LEG  . JOINT EFFUSION, KNEE  . HIGH BLOOD PRESSURE  . S/P total knee replacement  . Knee stiffness    PT - End of Session Activity Tolerance: Patient tolerated treatment well General Behavior During Session: [facility] for tasks performed Cognition: [facility] for tasks performed  Emeline Gins B 04/15/2011, 1:59 PM

## 2011-04-17 ENCOUNTER — Ambulatory Visit (HOSPITAL_COMMUNITY)
Admission: RE | Admit: 2011-04-17 | Discharge: 2011-04-17 | Disposition: A | Discharge status: Home / Self Care | Payer: BC Managed Care – PPO | Source: Ambulatory Visit

## 2011-04-17 ENCOUNTER — Telehealth (HOSPITAL_COMMUNITY): Payer: Self-pay

## 2011-04-17 DIAGNOSIS — Z96659 Presence of unspecified artificial knee joint: Secondary | ICD-10-CM

## 2011-04-17 DIAGNOSIS — M25669 Stiffness of unspecified knee, not elsewhere classified: Secondary | ICD-10-CM

## 2011-04-17 NOTE — Progress Notes (Signed)
Physical Therapy Treatment Patient Details  Name: Devin Callahan MRN: [id number] Date of Birth: Oct 21, 1945  Today's Date: 04/17/2011 Time: 1331-1430 Time Calculation (min): 59 min Visit#: 3 of 8 (30 total) Re-eval: 05/08/11 Charge: therex 19 min Manual 20 min Ice 10  Subjective: Symptoms/Limitations Symptoms: 5/10 knee, Monday was a fabulous day, pain/stiffness back today Pain Assessment Currently in Pain?: Yes Pain Score:   5 Pain Location: Knee Pain Orientation: Left  Precautions/Restrictions     Mobility (including Balance)       Exercise/Treatments Reciprocal bike x 10 min @ 4.0 seat 18 to 15 PRONE:  Contract relax 5x  TKF 20x 5"  PROM SUPINE:  Heel slides with terminal knee flexion 10 x 5 sets PROM 3x 30"  SITTING:  Self st flexion on mat at lowest height 3x 60"  STANDING:  Self st flexion on chair 4x 1 min  Modalities Modalities: Cryotherapy Cryotherapy Number Minutes Cryotherapy: 10 Minutes Cryotherapy Location: Knee Type of Cryotherapy: Ice pack  Physical Therapy Assessment and Plan PT Assessment and Plan Clinical Impression Statement: Pt tolerated manual treatment well with prone PROM flexion of 104 degrees.  PT Plan: Continue progressing ROM.    Goals    Problem List Patient Active Problem List  Diagnoses  . OSTEOARTHRITIS, LOWER LEG  . JOINT EFFUSION, KNEE  . HIGH BLOOD PRESSURE  . S/P total knee replacement  . Knee stiffness    General Behavior During Session: [facility] for tasks performed Cognition: [facility] for tasks performed  Juel Burrow 04/17/2011, 6:36 PM

## 2011-04-19 ENCOUNTER — Ambulatory Visit (HOSPITAL_COMMUNITY)
Admission: RE | Admit: 2011-04-19 | Discharge: 2011-04-19 | Disposition: A | Discharge status: Home / Self Care | Payer: BC Managed Care – PPO | Source: Ambulatory Visit | Attending: Physical Therapy | Admitting: Physical Therapy

## 2011-04-19 DIAGNOSIS — M25669 Stiffness of unspecified knee, not elsewhere classified: Secondary | ICD-10-CM

## 2011-04-19 DIAGNOSIS — Z96659 Presence of unspecified artificial knee joint: Secondary | ICD-10-CM

## 2011-04-19 NOTE — Progress Notes (Signed)
Physical Therapy Treatment Patient Details  Name: Devin Callahan MRN: [id number] Date of Birth: 1946-05-04  Today's Date: 04/19/2011 Time: 4098-1191 Time Calculation (min): 65 min Charges: TE 23', Man: 15', E-stim: 1 unit Visit#: 4 of 8 Re-eval: 05/08/11    Subjective: Symptoms/Limitations Symptoms: I am not doing so well today.  I was feeling great on Monday, but these last few days I have been stiff. Pt reports compliance with ther-ex Pain Assessment Pain Score:   3 Pain Location: Knee Pain Orientation: Left  Exercise/Treatments Bike 10' Seat 17>15 4.0 Prone: Quad Stretch 3x60 sec Standing: Self Stretch on Chair 3x90 sec Seated: Self stretch on EOB 3x90 sec  Modalities Modalities: Electrical Stimulation Manual Therapy Manual Therapy: Joint mobilization Joint Mobilization: Grade IV-V joint mobs to increase L knee flexion in prone w/belt x15 minutes  PROM: 0-95 in prone; 0-95 in sitting Electrical Stimulation Electrical Stimulation Location: L anterior knee joint Electrical Stimulation Action: High Volt x15 minutes w/ice Electrical Stimulation Parameters: Negative, Volts (325) Electrical Stimulation Goals: Edema  Physical Therapy Assessment and Plan PT Assessment and Plan Clinical Impression Statement: Pt had increased edema which is likely to be the main factor in the decreae in knee ROM today.  Pt continues to be independent with HEP and stretching.   PT Plan: Cont to progress ROM in all positions.     Goals    Problem List Patient Active Problem List  Diagnoses  . OSTEOARTHRITIS, LOWER LEG  . JOINT EFFUSION, KNEE  . HIGH BLOOD PRESSURE  . S/P total knee replacement  . Knee stiffness    PT - End of Session Activity Tolerance: Patient tolerated treatment well  Akaila Rambo 04/19/2011, 1:55 PM

## 2011-04-22 ENCOUNTER — Ambulatory Visit (HOSPITAL_COMMUNITY)
Admission: RE | Admit: 2011-04-22 | Discharge: 2011-04-22 | Disposition: A | Discharge status: Home / Self Care | Payer: BC Managed Care – PPO | Source: Ambulatory Visit | Attending: Family Medicine | Admitting: Family Medicine

## 2011-04-22 NOTE — Progress Notes (Signed)
Physical Therapy Treatment Patient Details  Name: Devin Callahan MRN: [id number] Date of Birth: 10/04/1945  Today's Date: 04/22/2011 Time: 1300-1403 Time Calculation (min): 63 min Visit#: 5 of 8 Re-eval: 05/08/11 Charges:  Therex 25 , Manual 10', estim with ice 12'    Subjective: Symptoms/Limitations Symptoms: 3/10 today; the weather is making it worse/stiff (rainy). Pain Assessment Currently in Pain?: Yes Pain Score:   3 Pain Location: Knee Pain Orientation: Left   Exercise/Treatments Bike 10' Seat 17>15 4.0  Prone:  Quad Stretch 3x60 sec  Standing:  Self Stretch on Chair 4X60 sec  Seated:  Self stretch on EOB 3x60 sec   Modalities Modalities: Electrical Stimulation;Cryotherapy Manual Therapy Manual Therapy: Joint mobilization Myofascial Release: L knee adhesions Other Manual Therapy: contract/relax to increase flexion Cryotherapy Type of Cryotherapy: Ice pack Pharmacologist Location: L anterior knee joint Electrical Stimulation Action: to decrease edema Electrical Stimulation Parameters: Negative, high volt at 325 Volts  Physical Therapy Assessment and Plan PT Assessment and Plan Clinical Impression Statement: Improved tolerance with PROM today after MFR.  Tightest in anterior aspect of knee but decreased adhesions/improved mobility following manual techniques. PT Treatment/Interventions: Therapeutic exercise (Manual techniques, High volt stim with icepact) PT Plan: Progress per POC.      Problem List Patient Active Problem List  Diagnoses  . OSTEOARTHRITIS, LOWER LEG  . JOINT EFFUSION, KNEE  . HIGH BLOOD PRESSURE  . S/P total knee replacement  . Knee stiffness    PT - End of Session Activity Tolerance: Patient tolerated treatment well General Behavior During Session: [facility] for tasks performed Cognition: [facility] for tasks performed  Emeline Gins B 04/22/2011, 1:53 PM

## 2011-04-24 ENCOUNTER — Ambulatory Visit (HOSPITAL_COMMUNITY)
Admission: RE | Admit: 2011-04-24 | Discharge: 2011-04-24 | Disposition: A | Discharge status: Home / Self Care | Payer: BC Managed Care – PPO | Source: Ambulatory Visit | Attending: Family Medicine | Admitting: Family Medicine

## 2011-04-24 NOTE — Progress Notes (Signed)
Physical Therapy Treatment Patient Details  Name: Devin Callahan MRN: [id number] Date of Birth: 08/30/1945  Today's Date: 04/24/2011 Time: 1440-1540 Time Calculation (min): 60 min Visit#: 3  of 4   Re-eval: 04/26/11 Charges:  Therex:  25, stim with ice 12', manual 10'    Subjective: Symptoms/Limitations Symptoms: Pt. reports stiffness but no pain today. Pain Assessment Currently in Pain?: No/denies   Exercise/Treatments Bike 10' Seat 17>15 4.0  Prone:  Quad Stretch 3x60 sec  Standing:  Self Stretch on Chair 4X60 sec  Seated:  Self stretch on EOB 3x60 sec   Modalities Modalities: Cryotherapy;Electrical Stimulation Manual Therapy Manual Therapy: Joint mobilization Myofascial Release: L knee adhesions Cryotherapy Number Minutes Cryotherapy: 10 Minutes Cryotherapy Location: Knee Type of Cryotherapy: Ice pack Pharmacologist Location: L anterior knee  Electrical Stimulation Action: to decrease edema Electrical Stimulation Parameters: Negative, high volt at 320 Volts Electrical Stimulation Goals: Edema  Physical Therapy Assessment and Plan PT Assessment and Plan Clinical Impression Statement: Pt. with 100 degrees AROM and 108 degrees PROM today. PT Treatment/Interventions: Therapeutic exercise (Manual techniques, high volt estim/ice) PT Plan: Re-eval next visit per PT POC.     Problem List Patient Active Problem List  Diagnoses  . OSTEOARTHRITIS, LOWER LEG  . JOINT EFFUSION, KNEE  . HIGH BLOOD PRESSURE  . S/P total knee replacement  . Knee stiffness    PT - End of Session Activity Tolerance: Patient tolerated treatment well General Behavior During Session: [facility] for tasks performed Cognition: [facility] for tasks performed  Lurena Nida 04/24/2011, 3:37 PM

## 2011-04-26 ENCOUNTER — Ambulatory Visit (HOSPITAL_COMMUNITY)
Admission: RE | Admit: 2011-04-26 | Discharge: 2011-04-26 | Disposition: A | Discharge status: Home / Self Care | Payer: BC Managed Care – PPO | Source: Ambulatory Visit | Attending: Family Medicine | Admitting: Family Medicine

## 2011-04-26 DIAGNOSIS — M25669 Stiffness of unspecified knee, not elsewhere classified: Secondary | ICD-10-CM

## 2011-04-26 DIAGNOSIS — Z96659 Presence of unspecified artificial knee joint: Secondary | ICD-10-CM

## 2011-04-26 NOTE — Progress Notes (Signed)
Physical Therapy Treatment/Progress Note Patient Details  Name: Devin Callahan MRN: [id number] Date of Birth: [birth year]  Today's Date: 04/26/2011 Time:1307-1350  Time: 41' Charges: 1 ROM, Manual: 25', TE: 13' Visit#: 34 Re-eval:      Subjective:  Pt reports that he doing okay. he remains stiff. he does not know why it will not get better. Pain on average 3/10. Pt reports he is about 65% better overall.  How long can you sit comfortably? No difficulty sitting in the chair, has a difficulty time standing up.  How long can you stand comfortably? No difficulty standing  How long can you walk comfortably? no difficulty.   Exercise/Treatments  04/26/11 0700  Knee Exercises: Stretches  Quad Stretch 3 reps;60 seconds  Knee: Self-Stretch to increase Flexion 4 reps;60 seconds  Knee: Self-Stretch Limitations chair stretch  Knee Exercises: Aerobic  Stationary Bike 10 min 5.0 seat 17->15  Knee Exercises: Seated  Other Seated Knee Exercises 3x 60 sec self knee flexion st with table on lowest height  Knee Exercises: Prone  Hamstring Curl Limitations;20 reps;2 sets     Manual Therapy Other Manual Therapy: Grade IV-V joint mobs to increase L knee flexion in prone w/belt x25 minutes PROM: 0-95 in prone; 0-95 in sitting, AROM: 0-105 Cryotherapy Number Minutes Cryotherapy: 10 Minutes Type of Cryotherapy: Ice pack (Not included in time)  Physical Therapy Assessment and Plan    Mr Hewitt Blade has attended 34 visits of outpatient PT in order to address pain, decreased ROM, decreased strength and balance. He has currently met 0/1 STG and 3/3 LTG.   Plan: Cont for 2 more visits to work on remaining ROM goal. Then pt will be D/C which is stated in today's progress note.   Goals  Met 0/1 STG, 4/4 LTG  Problem List Patient Active Problem List  Diagnoses  . OSTEOARTHRITIS, LOWER LEG  . JOINT EFFUSION, KNEE  . HIGH BLOOD PRESSURE  . S/P total knee replacement  . Knee stiffness       Devin Callahan 04/26/2011, 6:29 PM

## 2011-04-29 ENCOUNTER — Ambulatory Visit (HOSPITAL_COMMUNITY)
Admission: RE | Admit: 2011-04-29 | Discharge: 2011-04-29 | Disposition: A | Discharge status: Home / Self Care | Payer: BC Managed Care – PPO | Source: Ambulatory Visit | Attending: Family Medicine | Admitting: Family Medicine

## 2011-04-29 NOTE — Progress Notes (Signed)
Physical Therapy Treatment Patient Details  Name: Devin Callahan MRN: [id number] Date of Birth: 26-Jan-1946  Today's Date: 04/29/2011 Time: 4098-1191 Time Calculation (min): 56 min Visit#: 35  of 36   Re-eval: 05/02/11 Charges:  therex 25', manual 15', ice 10'    Subjective: Symptoms/Limitations Symptoms: Pt. reports its about the same today.  3/10 Pain Assessment Currently in Pain?: Yes Pain Score:   3 Pain Location: Knee Pain Orientation: Left   Exercise/Treatments Knee Exercises: Aerobic  Stationary Bike  10 min 5.0 seat 17->15  Knee Exercises: Stretches  Quad Stretch  3 reps;60 seconds  Knee: Self-Stretch to increase Flexion  4 reps;60 seconds  chair stretch  Knee Exercises: Seated  Other Seated Knee Exercises  3x 60 sec self knee flexion st with table on lowest height  Knee Exercises: Prone  Hamstring Curl  Limitations;20 reps;2 sets   Modalities Modalities: Cryotherapy Manual Therapy Other Manual Therapy: PROM /joint mobs to L knee to increase flexion. Cryotherapy Number Minutes Cryotherapy: 10 Minutes  Physical Therapy Assessment and Plan PT Assessment and Plan Clinical Impression Statement: Pt. is independent with therex to increase ROM/strength. PT Treatment/Interventions: Therapeutic exercise (Manual Techniques) PT Plan: Discharge next visit per PT POC.     Problem List Patient Active Problem List  Diagnoses  . OSTEOARTHRITIS, LOWER LEG  . JOINT EFFUSION, KNEE  . HIGH BLOOD PRESSURE  . S/P total knee replacement  . Knee stiffness    PT - End of Session Activity Tolerance: Patient tolerated treatment well General Behavior During Session: [facility] for tasks performed Cognition: [facility] for tasks performed  Emeline Gins B 04/29/2011, 1:44 PM

## 2011-05-01 ENCOUNTER — Ambulatory Visit (HOSPITAL_COMMUNITY)
Admission: RE | Admit: 2011-05-01 | Discharge: 2011-05-01 | Disposition: A | Discharge status: Home / Self Care | Payer: BC Managed Care – PPO | Source: Ambulatory Visit | Attending: Physical Therapy | Admitting: Physical Therapy

## 2011-05-01 DIAGNOSIS — Z96659 Presence of unspecified artificial knee joint: Secondary | ICD-10-CM

## 2011-05-01 DIAGNOSIS — M25669 Stiffness of unspecified knee, not elsewhere classified: Secondary | ICD-10-CM

## 2011-05-02 ENCOUNTER — Encounter: Payer: Self-pay | Admitting: Orthopedic Surgery

## 2011-05-02 ENCOUNTER — Ambulatory Visit (INDEPENDENT_AMBULATORY_CARE_PROVIDER_SITE_OTHER): Payer: BC Managed Care – PPO | Admitting: Orthopedic Surgery

## 2011-05-02 VITALS — Ht 75.0 in | Wt 282.0 lb

## 2011-05-02 DIAGNOSIS — Z96659 Presence of unspecified artificial knee joint: Secondary | ICD-10-CM

## 2011-05-02 MED ORDER — METHOCARBAMOL 500 MG PO TABS: 500.0000 mg | ORAL_TABLET | Freq: Four times a day (QID) | ORAL | Status: DC

## 2011-05-02 MED ORDER — OXYCODONE-ACETAMINOPHEN 5-325 MG PO TABS: 1.0000 | ORAL_TABLET | Freq: Four times a day (QID) | ORAL | Status: DC | PRN

## 2011-05-02 NOTE — Progress Notes (Signed)
LT TKA  on March 20 12th. Manipulation, June 20 12th. Knee flexion, 100 measured in my office.  Continue therapy twice a week.  Refill of Percocet, #40, q.6 hours refilled Robaxin 500 mg q.6 hours #62 refills

## 2011-05-02 NOTE — Patient Instructions (Signed)
Continue exercises

## 2011-05-03 ENCOUNTER — Ambulatory Visit (HOSPITAL_COMMUNITY)
Admission: RE | Admit: 2011-05-03 | Discharge: 2011-05-03 | Disposition: A | Discharge status: Home / Self Care | Payer: BC Managed Care – PPO | Source: Ambulatory Visit | Attending: Physical Therapy | Admitting: Physical Therapy

## 2011-05-03 DIAGNOSIS — M25669 Stiffness of unspecified knee, not elsewhere classified: Secondary | ICD-10-CM

## 2011-05-03 DIAGNOSIS — Z96659 Presence of unspecified artificial knee joint: Secondary | ICD-10-CM

## 2011-05-03 NOTE — Progress Notes (Signed)
Physical Therapy Treatment Patient Details  Name: Devin Callahan MRN: [id number] Date of Birth: 1946-02-06  Today's Date: 05/03/2011 Time: 4098-1191 Time Calculation (min): 59 min Charges: 25' manual, 10' TE (for biodex set up 3x) Visit#: 37  of    Re-eval: 06/02/11    Subjective: Symptoms/Limitations Symptoms: Pt states that he is still just stiff.  MD note states to continue 2x/wk. Pain Assessment Currently in Pain?: No/denies   Exercise/Treatments  Manual Therapy Joint Mobilization: Grade IV-V joint mobs to increase knee flexion in prone and supine w/PROM to follow/  Biodex holding in knee flexion x35 minutes  Physical Therapy Assessment and Plan PT Assessment and Plan Clinical Impression Statement: Completeing only manual and PROM techniques including Biodex PROM in flexion provided with only minor increase in PROM overall.   PT Plan: Continue with PROM techniques and Biodex for stretching for 8min-1 hour.  MD wants to pt to continue 2x/wk    Goals    Problem List Patient Active Problem List  Diagnoses  . OSTEOARTHRITIS, LOWER LEG  . JOINT EFFUSION, KNEE  . HIGH BLOOD PRESSURE  . S/P total knee replacement  . Knee stiffness    PT - End of Session Activity Tolerance: Patient tolerated treatment well  Karine Garn 05/03/2011, 1:45 PM

## 2011-05-08 ENCOUNTER — Ambulatory Visit (HOSPITAL_COMMUNITY)
Admission: RE | Admit: 2011-05-08 | Discharge: 2011-05-08 | Disposition: A | Discharge status: Home / Self Care | Payer: BC Managed Care – PPO | Source: Ambulatory Visit | Attending: Orthopedic Surgery | Admitting: Orthopedic Surgery

## 2011-05-08 DIAGNOSIS — M25569 Pain in unspecified knee: Secondary | ICD-10-CM | POA: Insufficient documentation

## 2011-05-08 DIAGNOSIS — M6281 Muscle weakness (generalized): Secondary | ICD-10-CM | POA: Insufficient documentation

## 2011-05-08 DIAGNOSIS — R262 Difficulty in walking, not elsewhere classified: Secondary | ICD-10-CM | POA: Insufficient documentation

## 2011-05-08 DIAGNOSIS — M25669 Stiffness of unspecified knee, not elsewhere classified: Secondary | ICD-10-CM | POA: Insufficient documentation

## 2011-05-08 DIAGNOSIS — Z96659 Presence of unspecified artificial knee joint: Secondary | ICD-10-CM

## 2011-05-08 DIAGNOSIS — IMO0001 Reserved for inherently not codable concepts without codable children: Secondary | ICD-10-CM | POA: Insufficient documentation

## 2011-05-08 NOTE — Progress Notes (Signed)
Physical Therapy Treatment Patient Details  Name: Devin Callahan MRN: [id number] Date of Birth: 1946-03-05  Today's Date: 05/08/2011 Time: 1350-1450 Time Calculation (min): 60 min Visit#: 37  of 38   Re-eval: 06/02/11  Charge: manual 15 min therex 8 min set up for biodex  Subjective: Symptoms/Limitations Symptoms: No pain today Pain Assessment Currently in Pain?: No/denies  Objective:   Exercise/Treatments 3x 60" self st knee flexion on chair 6x 30" contract/relax to increase knee flexion 6x 30" prone PROM  Manual Therapy Manual Therapy: Joint mobilization Joint Mobilization: Grade III-IV joint mobs to increase knee flexion in prone w/ PROM to follow. Other Manual Therapy: Biodex passive to increase knee flexion.  Physical Therapy Assessment and Plan PT Assessment and Plan Clinical Impression Statement: Completeing only manual and PROM (PROM 105 degrees prone) techniques including Biodex PROM (107 degrees) in flexion provided with only minor increase in PROM overall PT Plan: Continue with PROM techniques and Biodex for stretching for 93min-1 hour. MD wants to pt to continue 2x/wk     Goals    Problem List Patient Active Problem List  Diagnoses  . OSTEOARTHRITIS, LOWER LEG  . JOINT EFFUSION, KNEE  . HIGH BLOOD PRESSURE  . S/P total knee replacement  . Knee stiffness    PT - End of Session Activity Tolerance: Patient tolerated treatment well General Behavior During Session: [facility] for tasks performed Cognition: [facility] for tasks performed  Juel Burrow 05/08/2011, 3:54 PM

## 2011-05-10 ENCOUNTER — Ambulatory Visit (HOSPITAL_COMMUNITY)
Admission: RE | Admit: 2011-05-10 | Discharge: 2011-05-10 | Disposition: A | Discharge status: Home / Self Care | Payer: BC Managed Care – PPO | Source: Ambulatory Visit | Attending: Family Medicine | Admitting: Family Medicine

## 2011-05-10 NOTE — Progress Notes (Signed)
Physical Therapy Treatment Patient Details  Name: Devin Callahan MRN: [id number] Date of Birth: 05-14-1946  Today's Date: 05/10/2011 Time: 4098-1191 Time Calculation (min): 79 min Charges: manual x15 minutes, TEx15 minutes Visit#: 38  of 38   Re-eval: 06/02/11    Subjective: Symptoms/Limitations Symptoms: "How much longer do I have with therapy?"  Pt reports that he is doing pretty well.  Continues to have stiffness without pain.  Pain Assessment Currently in Pain?: No/denies  Exercise/Treatments Bike x10 minutes x4.0 for ROM Self knee stretch on chair 5x1 minute Kneeling on EOB 4x1 minute hold  Manual therapy in Supine with heel on and off mat to increase ROM w/grade IV-V joint mobs: x15 minutes PROM: 0-105 in all positions Biodex x45 minutes in flexion    Physical Therapy Assessment and Plan PT Assessment and Plan Clinical Impression Statement: Pt continues to maintain 105 degrees of PROM with alternate techniques.  PT Plan: Continue with PROM techniques and Biodex for stretching for 54min-1 hour. MD wants to pt to continue 2x/wk x1 more week.  Contracted Dr. Alto Denver via inbasket to discuss Jaz brace.    Goals    Problem List Patient Active Problem List  Diagnoses  . OSTEOARTHRITIS, LOWER LEG  . JOINT EFFUSION, KNEE  . HIGH BLOOD PRESSURE  . S/P total knee replacement  . Knee stiffness    PT - End of Session Activity Tolerance: Patient tolerated treatment well  Nate Common 05/10/2011, 1:39 PM

## 2011-05-14 ENCOUNTER — Ambulatory Visit (HOSPITAL_COMMUNITY)
Admission: RE | Admit: 2011-05-14 | Discharge: 2011-05-14 | Disposition: A | Discharge status: Home / Self Care | Payer: BC Managed Care – PPO | Source: Ambulatory Visit

## 2011-05-14 DIAGNOSIS — M25669 Stiffness of unspecified knee, not elsewhere classified: Secondary | ICD-10-CM

## 2011-05-14 DIAGNOSIS — Z96659 Presence of unspecified artificial knee joint: Secondary | ICD-10-CM

## 2011-05-14 LAB — DIFFERENTIAL
Lymphs Abs: 1.5
Monocytes Relative: 9
Neutro Abs: 6.3
Neutrophils Relative %: 69

## 2011-05-14 LAB — CBC
MCV: 80.6
RBC: 3.71 — ABNORMAL LOW
WBC: 9.1

## 2011-05-14 NOTE — Progress Notes (Signed)
Physical Therapy Treatment Patient Details  Name: ZYEN TRIGGS MRN: [id number] Date of Birth: 11/21/1945  Today's Date: 05/14/2011 Time: 1434-1600 Time Calculation (min): 86 min Visit#: 39  of 39   Re-eval: 06/02/11  Charge: therex:16 min Manual 20 Biodex 40 min- no charge  Subjective: Symptoms/Limitations Symptoms: Stiff knee today, weather in my knee, maybe a 3/10 pain scale. Pain Assessment Currently in Pain?: Yes Pain Score:   3 Pain Location: Knee Pain Orientation: Left  Objective:  Exercise/Treatments Bike x10 minutes x4.0 for ROM seat from 17 -->14 Self knee stretch on chair 5x1 minute  Kneeling on EOB 4x1 minute hold  Manual therapy in prone to increase ROM w/grade IV joint mobs: x20 minutes  PROM: 0-105 in all positions  Biodex x40 minutes in flexion    Physical Therapy Assessment and Plan PT Assessment and Plan Clinical Impression Statement: Pt continues to maintain 105 degrees of PROM with alternate techniques.   PT Plan: Continue with PROM techniques and Biodex for stretching for 40min-1 hour. MD wants to pt to continue 2x/wk x1 more week. Awaiting order from Dr Romeo Apple for [facility] brace    Goals    Problem List Patient Active Problem List  Diagnoses  . OSTEOARTHRITIS, LOWER LEG  . JOINT EFFUSION, KNEE  . HIGH BLOOD PRESSURE  . S/P total knee replacement  . Knee stiffness    PT - End of Session Activity Tolerance: Patient tolerated treatment well General Behavior During Session: [facility] for tasks performed Cognition: [facility] for tasks performed  Juel Burrow 05/14/2011, 4:04 PM

## 2011-05-17 ENCOUNTER — Ambulatory Visit (HOSPITAL_COMMUNITY)
Admission: RE | Admit: 2011-05-17 | Discharge: 2011-05-17 | Disposition: A | Discharge status: Home / Self Care | Payer: BC Managed Care – PPO | Source: Ambulatory Visit | Attending: Family Medicine | Admitting: Family Medicine

## 2011-05-17 DIAGNOSIS — M25669 Stiffness of unspecified knee, not elsewhere classified: Secondary | ICD-10-CM

## 2011-05-17 DIAGNOSIS — Z96659 Presence of unspecified artificial knee joint: Secondary | ICD-10-CM

## 2011-05-17 NOTE — Progress Notes (Signed)
Physical Therapy Treatment Patient Details  Name: Devin Callahan MRN: [id number] Date of Birth: Sep 26, 1945  Today's Date: 05/17/2011 Time: 4098-1191 Time Calculation (min): 46 min Charges: 1 ROM, 20' manual Visit#: 40  of 40   Re-eval:      Subjective:   Pt reports that is has improved functional activities and is able to ambulate up and down his own stairs    Exercise/Treatments Bike x10 minutes Self stretch on chair 5x1 minute Measurments for [name] brace Manual therapy to increase ROM w/STM after to decrease pain to posteriomedial region x20 minutes    Physical Therapy Assessment and Plan PT Assessment and Plan Clinical Impression Statement: Pt continues to be limited in his A and PROM.  AROM: 0-95; PROM 0-106.  Measured pt for a [name] brace today and sent to company to follow up to continue with ROM at home.  PT Plan: D/C with advacned HEP and [name] measurments sent to [name]     Goals    Problem List Patient Active Problem List  Diagnoses  . OSTEOARTHRITIS, LOWER LEG  . JOINT EFFUSION, KNEE  . HIGH BLOOD PRESSURE  . S/P total knee replacement  . Knee stiffness    PT - End of Session Activity Tolerance: Patient tolerated treatment well  Devin Callahan 05/17/2011, 2:39 PM

## 2011-05-21 ENCOUNTER — Ambulatory Visit (HOSPITAL_COMMUNITY): Payer: BC Managed Care – PPO | Admitting: Physical Therapy

## 2011-05-24 ENCOUNTER — Ambulatory Visit (HOSPITAL_COMMUNITY): Payer: BC Managed Care – PPO

## 2011-05-28 ENCOUNTER — Ambulatory Visit (HOSPITAL_COMMUNITY): Payer: BC Managed Care – PPO

## 2011-05-31 ENCOUNTER — Ambulatory Visit (HOSPITAL_COMMUNITY): Payer: BC Managed Care – PPO

## 2011-06-04 ENCOUNTER — Ambulatory Visit (HOSPITAL_COMMUNITY): Payer: BC Managed Care – PPO | Admitting: Physical Therapy

## 2011-07-04 ENCOUNTER — Ambulatory Visit: Payer: BC Managed Care – PPO | Admitting: Orthopedic Surgery

## 2011-07-11 ENCOUNTER — Encounter: Payer: Self-pay | Admitting: Orthopedic Surgery

## 2011-07-11 ENCOUNTER — Ambulatory Visit (INDEPENDENT_AMBULATORY_CARE_PROVIDER_SITE_OTHER): Payer: BC Managed Care – PPO | Admitting: Orthopedic Surgery

## 2011-07-11 VITALS — Ht 75.0 in | Wt 282.0 lb

## 2011-07-11 DIAGNOSIS — Z96659 Presence of unspecified artificial knee joint: Secondary | ICD-10-CM

## 2011-07-11 NOTE — Progress Notes (Signed)
Patient ID: Devin Callahan, male   DOB: [birth year], 65 y.o.   MRN: [id number] Mr. Kennedy Bucker had LEFT total knee replacement on October 23, 2010 and a manipulation on January 23, 2011 he can now climb the stairs he just can't sit in a chair she does not have any pain in his knee  He status post lumbar disc surgery followed by 12 epidural injections secondary to continued pain over 12 years ago now has some lateral thigh pain numbness and tingling any standing for long periods of time  His knee is otherwise doing well we will get an x-ray of his back in 3 months

## 2011-07-11 NOTE — Patient Instructions (Signed)
Normal activity  Exercise

## 2011-08-27 DIAGNOSIS — D3A098 Benign carcinoid tumors of other sites: Secondary | ICD-10-CM | POA: Insufficient documentation

## 2011-10-10 ENCOUNTER — Ambulatory Visit (INDEPENDENT_AMBULATORY_CARE_PROVIDER_SITE_OTHER): Payer: BC Managed Care – PPO | Admitting: Orthopedic Surgery

## 2011-10-10 ENCOUNTER — Encounter: Payer: Self-pay | Admitting: Orthopedic Surgery

## 2011-10-10 VITALS — BP 130/86 | Ht 75.0 in | Wt 295.0 lb

## 2011-10-10 DIAGNOSIS — G5790 Unspecified mononeuropathy of unspecified lower limb: Secondary | ICD-10-CM | POA: Insufficient documentation

## 2011-10-10 MED ORDER — GABAPENTIN 100 MG PO CAPS: 100.0000 mg | ORAL_CAPSULE | Freq: Every day | ORAL | Status: DC

## 2011-10-10 NOTE — Patient Instructions (Signed)
New med Gabapentin one daily

## 2011-10-10 NOTE — Progress Notes (Signed)
  Subjective:    Devin Callahan is a 66 y.o. male who presents with chief complaint of numbness along the lateral side of his LEFT leg. Subjective:    thank for 3 months numbness for 3 months came on gradually no trauma dull aching pain 4/10 appears to be located lateral to his knee incision on the lateral side of his thigh it doesn't radiate to his hip doesn't radiate below his knee worse with standing better with sitting.  The following portions of the patient's history were reviewed and updated as appropriate: allergies, current medications, past family history, past medical history, past social history, past surgical history and problem list.   Review of Systems A comprehensive review of systems was negative except for: some wheezing and seasonal ALLERGIES   Objective:    BP 120/70  Ht 5\' 3"  (1.6 m)  Wt 154 lb (69.854 kg)  BMI 27.28 kg/m2  LMP 09/14/2011  Vital signs are stable as recorded  General appearance is normal  The patient is alert and oriented x3  The patient's mood and affect are normal  Gait assessment: he walks without any support for gait. The cardiovascular exam reveals normal pulses and temperature without edema swelling.  The lymphatic system is negative for palpable lymph nodes  The sensory exam is normal.  There are no pathologic reflexes.  Balance is normal.   Exam of the LEFT leg Inspection there is a well-healed incision of the anterior aspect of the LEFT knee there is decreased sensation lateral to the knee from the superficial branch of the saphenous nerve. Range of motion knee flexion 110 Stability normal Strength normal Skin normal  There is a negative straight leg raise the lateral border of the thigh has normal sensation         X-ray   Assessment:    Left leg mononeuritis    Plan:   Recommend Neurontin 100 mg at night or during the day just one tablet a day for now.  He does have some lumbar disc disease and this may be  related

## 2012-01-09 ENCOUNTER — Other Ambulatory Visit: Payer: Self-pay | Admitting: *Deleted

## 2012-01-09 ENCOUNTER — Ambulatory Visit (INDEPENDENT_AMBULATORY_CARE_PROVIDER_SITE_OTHER): Payer: BC Managed Care – PPO | Admitting: Orthopedic Surgery

## 2012-01-09 ENCOUNTER — Encounter: Payer: Self-pay | Admitting: Orthopedic Surgery

## 2012-01-09 VITALS — BP 140/80 | Ht 75.0 in | Wt 295.0 lb

## 2012-01-09 DIAGNOSIS — M1710 Unilateral primary osteoarthritis, unspecified knee: Secondary | ICD-10-CM

## 2012-01-09 DIAGNOSIS — G5790 Unspecified mononeuropathy of unspecified lower limb: Secondary | ICD-10-CM

## 2012-01-09 DIAGNOSIS — Z96659 Presence of unspecified artificial knee joint: Secondary | ICD-10-CM

## 2012-01-09 MED ORDER — GABAPENTIN 100 MG PO CAPS: 100.0000 mg | ORAL_CAPSULE | Freq: Every day | ORAL | Status: DC

## 2012-01-09 NOTE — Progress Notes (Signed)
Subjective:     Patient ID: Devin Callahan, male   DOB: [birth year], 66 y.o.   MRN: [id number]   Chief Complaint  Patient presents with  . Follow-up    3 month recheck on left leg numbness.    HPI left tka 10/23/2010; left mua.01/23/2011, h/o spinal stenosis c/o left leg numbness but control with neurontin 100 mg daily     Review of Systems     Objective:   Physical Exam Knee flexion is now 105    Assessment:     Doing well overall, gabapentin, control and numbness    Plan:     Followup in one year for x-rays of both total knees

## 2012-01-09 NOTE — Patient Instructions (Signed)
GABAPENTIN AS NEEDED OK TO INCREASE TO 2 TABS AT NIGHT

## 2013-01-28 ENCOUNTER — Ambulatory Visit: Payer: BC Managed Care – PPO | Admitting: Orthopedic Surgery

## 2013-02-04 ENCOUNTER — Ambulatory Visit (INDEPENDENT_AMBULATORY_CARE_PROVIDER_SITE_OTHER): Payer: Medicare Other

## 2013-02-04 ENCOUNTER — Ambulatory Visit (INDEPENDENT_AMBULATORY_CARE_PROVIDER_SITE_OTHER): Payer: Medicare Other | Admitting: Orthopedic Surgery

## 2013-02-04 VITALS — BP 128/86 | Ht 75.0 in | Wt 296.0 lb

## 2013-02-04 DIAGNOSIS — Z96651 Presence of right artificial knee joint: Secondary | ICD-10-CM

## 2013-02-04 DIAGNOSIS — Z96659 Presence of unspecified artificial knee joint: Secondary | ICD-10-CM

## 2013-02-04 DIAGNOSIS — Z96653 Presence of artificial knee joint, bilateral: Secondary | ICD-10-CM

## 2013-02-04 DIAGNOSIS — Z96652 Presence of left artificial knee joint: Secondary | ICD-10-CM

## 2013-02-04 NOTE — Progress Notes (Signed)
Patient ID: Devin Callahan, male   DOB: 11-09-1945, 67 y.o.   MRN: [id number] Chief Complaint  Patient presents with  . Follow-up    Yearly recheck Bilateral Knee TKA's DOS 10/15/10 left 2009 right knee    Routine followup bilateral total knees as described above right total knee 2012 left total knee 2009  No back pain history of mononeuritis however.  Previous surgery on his lumbar spine.  He says left knee still doesn't meniscus right but is having no pain in either knee  Physical Exam(6) GENERAL: normal development   CDV: pulses are normal   Skin: normal  Psychiatric: awake, alert and oriented  Neuro: normal sensation  MSK Gait:  Normal no assisted devices 1 left knee flexion 110, right knee flexion 120 2 both knees are stable 3 strength normal in both knees 4 no tenderness or swelling  Imaging: Bilateral knee replacement x-rays  Assessment: X-rays look fine no loosening    Plan: Return 1 year bilateral knee films

## 2013-02-04 NOTE — Patient Instructions (Signed)
activities as tolerated 

## 2013-08-24 ENCOUNTER — Ambulatory Visit (HOSPITAL_COMMUNITY)
Admission: RE | Admit: 2013-08-24 | Discharge: 2013-08-24 | Disposition: A | Discharge status: Home / Self Care | Payer: Medicare Other | Source: Ambulatory Visit | Attending: Family Medicine | Admitting: Family Medicine

## 2013-08-24 ENCOUNTER — Other Ambulatory Visit (HOSPITAL_COMMUNITY): Payer: Self-pay | Admitting: Family Medicine

## 2013-08-24 DIAGNOSIS — I517 Cardiomegaly: Secondary | ICD-10-CM | POA: Insufficient documentation

## 2013-08-24 DIAGNOSIS — Z87891 Personal history of nicotine dependence: Secondary | ICD-10-CM

## 2013-08-24 DIAGNOSIS — M47814 Spondylosis without myelopathy or radiculopathy, thoracic region: Secondary | ICD-10-CM | POA: Insufficient documentation

## 2013-08-24 DIAGNOSIS — R635 Abnormal weight gain: Secondary | ICD-10-CM

## 2013-08-24 DIAGNOSIS — R0602 Shortness of breath: Secondary | ICD-10-CM | POA: Insufficient documentation

## 2013-09-13 ENCOUNTER — Other Ambulatory Visit (HOSPITAL_COMMUNITY): Payer: Self-pay | Admitting: Family Medicine

## 2013-09-13 ENCOUNTER — Ambulatory Visit (HOSPITAL_COMMUNITY)
Admission: RE | Admit: 2013-09-13 | Discharge: 2013-09-13 | Disposition: A | Discharge status: Home / Self Care | Payer: Medicare Other | Source: Ambulatory Visit | Attending: Family Medicine | Admitting: Family Medicine

## 2013-09-13 DIAGNOSIS — R0989 Other specified symptoms and signs involving the circulatory and respiratory systems: Principal | ICD-10-CM | POA: Insufficient documentation

## 2013-09-13 DIAGNOSIS — R0600 Dyspnea, unspecified: Secondary | ICD-10-CM

## 2013-09-13 DIAGNOSIS — R0609 Other forms of dyspnea: Secondary | ICD-10-CM | POA: Insufficient documentation

## 2014-02-08 ENCOUNTER — Encounter: Payer: Self-pay | Admitting: Orthopedic Surgery

## 2014-02-08 ENCOUNTER — Ambulatory Visit (INDEPENDENT_AMBULATORY_CARE_PROVIDER_SITE_OTHER): Payer: Medicare Other | Admitting: Orthopedic Surgery

## 2014-02-08 ENCOUNTER — Ambulatory Visit (INDEPENDENT_AMBULATORY_CARE_PROVIDER_SITE_OTHER): Payer: Medicare Other

## 2014-02-08 VITALS — BP 157/99 | Ht 75.0 in | Wt 296.0 lb

## 2014-02-08 DIAGNOSIS — M1711 Unilateral primary osteoarthritis, right knee: Secondary | ICD-10-CM

## 2014-02-08 DIAGNOSIS — M1712 Unilateral primary osteoarthritis, left knee: Secondary | ICD-10-CM

## 2014-02-08 DIAGNOSIS — M1710 Unilateral primary osteoarthritis, unspecified knee: Secondary | ICD-10-CM

## 2014-02-08 DIAGNOSIS — Z96651 Presence of right artificial knee joint: Secondary | ICD-10-CM

## 2014-02-08 DIAGNOSIS — Z96659 Presence of unspecified artificial knee joint: Secondary | ICD-10-CM

## 2014-02-08 DIAGNOSIS — Z96652 Presence of left artificial knee joint: Secondary | ICD-10-CM

## 2014-02-08 NOTE — Progress Notes (Signed)
Patient ID: GOKUL WAYBRIGHT, male   DOB: 1945-11-28, 68 y.o.   MRN: [id number]  Chief Complaint  Patient presents with  . Follow-up    1 year xray bilateral knees, 2012 rt knee, 2009 left knee    HISTORY:  68 yr old male status post bilateral total knees doing well, he did slipped getting into his truck injured his left leg had some trouble with flexion of the hip and some pain running from the ankle up into the hip.  No catching locking or giving way  Pain is improving  Review of systems constitutional no fever or chills or fatigue. No rash. No numbness or tingling.  Vital signs: BP 157/99  Ht 6\' 3"  (1.905 m)  Wt 296 lb (134.265 kg)  BMI 37.00 kg/m2   General the patient is well-developed and well-nourished grooming and hygiene are normal Oriented x3 Mood and affect normal Ambulation normal, without assistive devices  Inspection of the knees: Both skin incisions are normal. Right knee flexion 110 left knee flexion 105. AP and coronal plane stability is normal bilaterally motor exam intact no hip pain no hip tenderness skin clean dry and intact as well no swelling  Cardiovascular normal perfusion both legs with normal sensation both legs  X-rays were normal. In both legs.  Followup in a year.

## 2014-09-08 ENCOUNTER — Other Ambulatory Visit: Payer: Self-pay | Admitting: General Surgery

## 2014-11-11 ENCOUNTER — Emergency Department (HOSPITAL_COMMUNITY): Payer: Medicare Other

## 2014-11-11 ENCOUNTER — Inpatient Hospital Stay (HOSPITAL_COMMUNITY)
Admission: EM | Admit: 2014-11-11 | Discharge: 2014-11-17 | DRG: 027 | Disposition: A | Discharge status: Home Health | Payer: Medicare Other | Attending: Neurological Surgery | Admitting: Neurological Surgery

## 2014-11-11 ENCOUNTER — Encounter (HOSPITAL_COMMUNITY): Payer: Self-pay | Admitting: Physical Medicine and Rehabilitation

## 2014-11-11 DIAGNOSIS — Z96653 Presence of artificial knee joint, bilateral: Secondary | ICD-10-CM | POA: Diagnosis not present

## 2014-11-11 DIAGNOSIS — Z7951 Long term (current) use of inhaled steroids: Secondary | ICD-10-CM

## 2014-11-11 DIAGNOSIS — R410 Disorientation, unspecified: Secondary | ICD-10-CM | POA: Diagnosis present

## 2014-11-11 DIAGNOSIS — Z79899 Other long term (current) drug therapy: Secondary | ICD-10-CM | POA: Diagnosis not present

## 2014-11-11 DIAGNOSIS — D320 Benign neoplasm of cerebral meninges: Secondary | ICD-10-CM | POA: Diagnosis not present

## 2014-11-11 DIAGNOSIS — Z9889 Other specified postprocedural states: Secondary | ICD-10-CM

## 2014-11-11 DIAGNOSIS — S065XAA Traumatic subdural hemorrhage with loss of consciousness status unknown, initial encounter: Secondary | ICD-10-CM | POA: Diagnosis present

## 2014-11-11 DIAGNOSIS — S065X9A Traumatic subdural hemorrhage with loss of consciousness of unspecified duration, initial encounter: Secondary | ICD-10-CM

## 2014-11-11 DIAGNOSIS — I6200 Nontraumatic subdural hemorrhage, unspecified: Principal | ICD-10-CM | POA: Diagnosis present

## 2014-11-11 LAB — MRSA PCR SCREENING: MRSA by PCR: NEGATIVE

## 2014-11-11 LAB — CBC
HCT: 41.7 % (ref 39.0–52.0)
HEMATOCRIT: 40 % (ref 39.0–52.0)
Hemoglobin: 13.6 g/dL (ref 13.0–17.0)
Hemoglobin: 13.7 g/dL (ref 13.0–17.0)
MCH: 28.9 pg (ref 26.0–34.0)
MCH: 28.6 pg (ref 26.0–34.0)
MCHC: 34 g/dL (ref 30.0–36.0)
MCHC: 32.9 g/dL (ref 30.0–36.0)
MCV: 85.1 fL (ref 78.0–100.0)
MCV: 87.1 fL (ref 78.0–100.0)
PLATELETS: 193 10*3/uL (ref 150–400)
Platelets: 183 10*3/uL (ref 150–400)
RBC: 4.7 MIL/uL (ref 4.22–5.81)
RBC: 4.79 MIL/uL (ref 4.22–5.81)
RDW: 13.9 % (ref 11.5–15.5)
RDW: 14.1 % (ref 11.5–15.5)
WBC: 7.6 10*3/uL (ref 4.0–10.5)
WBC: 8.6 10*3/uL (ref 4.0–10.5)

## 2014-11-11 LAB — COMPREHENSIVE METABOLIC PANEL
ALBUMIN: 3.8 g/dL (ref 3.5–5.2)
ALT: 32 U/L (ref 0–53)
ALT: 31 U/L (ref 0–53)
ANION GAP: 8 (ref 5–15)
ANION GAP: 5 (ref 5–15)
AST: 34 U/L (ref 0–37)
AST: 34 U/L (ref 0–37)
Albumin: 3.7 g/dL (ref 3.5–5.2)
Alkaline Phosphatase: 63 U/L (ref 39–117)
Alkaline Phosphatase: 63 U/L (ref 39–117)
BUN: 8 mg/dL (ref 6–23)
BUN: 7 mg/dL (ref 6–23)
CO2: 31 mmol/L (ref 19–32)
CO2: 35 mmol/L — ABNORMAL HIGH (ref 19–32)
CREATININE: 0.79 mg/dL (ref 0.50–1.35)
Calcium: 8.9 mg/dL (ref 8.4–10.5)
Calcium: 8.9 mg/dL (ref 8.4–10.5)
Chloride: 100 mmol/L (ref 96–112)
Chloride: 100 mmol/L (ref 96–112)
Creatinine, Ser: 0.75 mg/dL (ref 0.50–1.35)
GFR calc Af Amer: >90 mL/min (ref >90)
GFR calc non Af Amer: >90 mL/min (ref >90)
GFR calc non Af Amer: >90 mL/min (ref >90)
GLUCOSE: 97 mg/dL (ref 70–99)
Glucose, Bld: 107 mg/dL — ABNORMAL HIGH (ref 70–99)
Potassium: 3.1 mmol/L — ABNORMAL LOW (ref 3.5–5.1)
Potassium: 3.1 mmol/L — ABNORMAL LOW (ref 3.5–5.1)
SODIUM: 140 mmol/L (ref 135–145)
Sodium: 139 mmol/L (ref 135–145)
Total Bilirubin: 0.7 mg/dL (ref 0.3–1.2)
Total Bilirubin: 0.7 mg/dL (ref 0.3–1.2)
Total Protein: 7.2 g/dL (ref 6.0–8.3)
Total Protein: 7.2 g/dL (ref 6.0–8.3)

## 2014-11-11 LAB — URINALYSIS, ROUTINE W REFLEX MICROSCOPIC
BILIRUBIN URINE: NEGATIVE
Glucose, UA: NEGATIVE mg/dL
HGB URINE DIPSTICK: NEGATIVE
KETONES UR: NEGATIVE mg/dL
Leukocytes, UA: NEGATIVE
Nitrite: NEGATIVE
PROTEIN: NEGATIVE mg/dL
Specific Gravity, Urine: 1.014 (ref 1.005–1.030)
Urobilinogen, UA: 0.2 mg/dL (ref 0.0–1.0)
pH: 8 (ref 5.0–8.0)

## 2014-11-11 LAB — PROTIME-INR
INR: 1 (ref 0.00–1.49)
INR: 1.08 (ref 0.00–1.49)
PROTHROMBIN TIME: 13.3 s (ref 11.6–15.2)
Prothrombin Time: 14.1 seconds (ref 11.6–15.2)

## 2014-11-11 LAB — CG4 I-STAT (LACTIC ACID): Lactic Acid, Venous: 0.93 mmol/L (ref 0.5–2.0)

## 2014-11-11 LAB — I-STAT TROPONIN, ED: TROPONIN I, POC: 0 ng/mL (ref 0.00–0.08)

## 2014-11-11 LAB — APTT: aPTT: 28 seconds (ref 24–37)

## 2014-11-11 LAB — I-STAT CG4 LACTIC ACID, ED: LACTIC ACID, VENOUS: 0.8 mmol/L (ref 0.5–2.0)

## 2014-11-11 MED ORDER — AMLODIPINE BESYLATE 10 MG PO TABS
10.0000 mg | ORAL_TABLET | Freq: Every day | ORAL | Status: DC
  Administered 2014-11-13 – 2014-11-17 (×5): 10 mg via ORAL
  Filled 2014-11-11 (×7): qty 1

## 2014-11-11 MED ORDER — ACETAMINOPHEN 650 MG RE SUPP: 650.0000 mg | RECTAL | Status: DC | PRN

## 2014-11-11 MED ORDER — GABAPENTIN 100 MG PO CAPS
100.0000 mg | ORAL_CAPSULE | Freq: Every day | ORAL | Status: DC
  Administered 2014-11-13 – 2014-11-17 (×5): 100 mg via ORAL
  Filled 2014-11-11 (×7): qty 1

## 2014-11-11 MED ORDER — ACETAMINOPHEN-CODEINE #3 300-30 MG PO TABS: 1.0000 | ORAL_TABLET | ORAL | Status: DC | PRN

## 2014-11-11 MED ORDER — HYDROCHLOROTHIAZIDE 25 MG PO TABS
25.0000 mg | ORAL_TABLET | Freq: Every day | ORAL | Status: DC
  Administered 2014-11-13 – 2014-11-17 (×5): 25 mg via ORAL
  Filled 2014-11-11 (×7): qty 1

## 2014-11-11 MED ORDER — SENNOSIDES-DOCUSATE SODIUM 8.6-50 MG PO TABS
1.0000 | ORAL_TABLET | Freq: Two times a day (BID) | ORAL | Status: DC
  Filled 2014-11-11 (×4): qty 1

## 2014-11-11 MED ORDER — ALBUTEROL SULFATE (2.5 MG/3ML) 0.083% IN NEBU: 3.0000 mL | INHALATION_SOLUTION | Freq: Four times a day (QID) | RESPIRATORY_TRACT | Status: DC | PRN

## 2014-11-11 MED ORDER — LABETALOL HCL 5 MG/ML IV SOLN
10.0000 mg | INTRAVENOUS | Status: DC | PRN
  Administered 2014-11-11: 10 mg via INTRAVENOUS
  Filled 2014-11-11: qty 4

## 2014-11-11 MED ORDER — ACETAMINOPHEN 325 MG PO TABS: 650.0000 mg | ORAL_TABLET | ORAL | Status: DC | PRN

## 2014-11-11 MED ORDER — MORPHINE SULFATE 2 MG/ML IJ SOLN: 1.0000 mg | INTRAMUSCULAR | Status: DC | PRN

## 2014-11-11 MED ORDER — SODIUM CHLORIDE 0.9 % IV SOLN
500.0000 mg | Freq: Two times a day (BID) | INTRAVENOUS | Status: DC
  Administered 2014-11-11 – 2014-11-12 (×2): 500 mg via INTRAVENOUS
  Filled 2014-11-11 (×3): qty 5

## 2014-11-11 MED ORDER — PANTOPRAZOLE SODIUM 40 MG IV SOLR
40.0000 mg | Freq: Every day | INTRAVENOUS | Status: DC
  Administered 2014-11-11: 40 mg via INTRAVENOUS
  Filled 2014-11-11 (×2): qty 40

## 2014-11-11 NOTE — H&P (Signed)
Reason for Consult: L SDH Referring Physician: EDP  Devin Callahan is an 69 y.o. male.   HPI:  69 year old left-handed African-American male seen in neurosurgical consultation regarding a left subacute subdural hematoma. He denies history of trauma. He started having headaches 8-10 days ago. The headaches have been slowly progressive. They are rated a 7 out of 10 on the pain scale but it is variable in intensity and aching in character. No visual changes. No nausea or vomiting. No seizure activity. No speech Difficulty. May be some slowness to respond to questioning and some change in gait (the patient denies this but the incisions assistant who first saw him states that these things were mentioned by family). CT scan showed a large left subacute subdural hematoma with mass effect and shift and neurosurgical evaluation was requested. The patient recently ate breakfast. He denies use of antiplatelet agents or anticoagulants.  Past Medical History  Diagnosis Date  . High blood pressure     Cholesterol    Past Surgical History  Procedure Laterality Date  . Foot surgery    . Back surgery  1999  . Colon surgery  2009  . Knee surgery  3.20.2012    knee replacement LEFT    . Knee surgery      RIGHT knee replacement    Allergies  Allergen Reactions  . Other Nausea And Vomiting    olives  . Shrimp [Shellfish Allergy] Nausea And Vomiting    History  Substance Use Topics  . Smoking status: Never Smoker   . Smokeless tobacco: Not on file  . Alcohol Use: No    History reviewed. No pertinent family history.   Review of Systems  Positive ROS: Negative  All other systems have been reviewed and were otherwise negative with the exception of those mentioned in the HPI and as above.  Objective: Vital signs in last 24 hours: Temp:  [98.1 F (36.7 C)] 98.1 F (36.7 C) (04/08 1036) Pulse Rate:  [65-76] 65 (04/08 1230) Resp:  [13-16] 13 (04/08 1230) BP: (156-157)/(75-98) 156/75 mmHg (04/08  1230) SpO2:  [95 %-98 %] 98 % (04/08 1230) Weight:  [301 lb (136.533 kg)] 301 lb (136.533 kg) (04/08 1036)  General Appearance: Alert, cooperative, no distress, appears stated age Head: Normocephalic, without obvious abnormality, atraumatic Eyes: PERRL, conjunctiva/corneas clear, EOM's intact Neck: Supple, symmetrical, trachea midline, no adenopathy; thyroid: No enlargement/tenderness/nodules; no carotid bruit or JVD Lungs:  respirations unlabored Heart: Regular rate and rhythm Abdomen: Soft, non-tender, bowel sounds active all four quadrants, no masses, no organomegaly Extremities: Extremities normal, atraumatic, no cyanosis or edema Pulses: 2+ and symmetric all extremities Skin: Skin color, texture, turgor normal, no rashes or lesions  NEUROLOGIC:   Mental status: A&O x4, no aphasia, good attention span, Memory and fund of knowledge Motor Exam - grossly normal, normal tone and bulk, no pronator drift Sensory Exam - grossly normal Reflexes: symmetric, no pathologic reflexes, No Hoffman's, No clonus Coordination - grossly normal Gait -not tested Cranial Nerves: I: smell Not tested  II: visual acuity  OS: na    OD: na  II: visual fields Full to confrontation  II: pupils Equal, round, reactive to light  III,VII: ptosis None  III,IV,VI: extraocular muscles  Full ROM  V: mastication Normal  V: facial light touch sensation  Normal  V,VII: corneal reflex  Present  VII: facial muscle function - upper  Normal  VII: facial muscle function - lower Normal  VIII: hearing Not tested  IX: soft palate  elevation  Normal  IX,X: gag reflex Present  XI: trapezius strength  5/5  XI: sternocleidomastoid strength 5/5  XI: neck flexion strength  5/5  XII: tongue strength  Normal    Data Review Lab Results  Component Value Date   WBC 7.6 11/11/2014   HGB 13.6 11/11/2014   HCT 40.0 11/11/2014   MCV 85.1 11/11/2014   PLT 193 11/11/2014   Lab Results  Component Value Date   NA 139  11/11/2014   K 3.1* 11/11/2014   CL 100 11/11/2014   CO2 31 11/11/2014   BUN 8 11/11/2014   CREATININE 0.79 11/11/2014   GLUCOSE 107* 11/11/2014   Lab Results  Component Value Date   INR 0.94 10/11/2010    Radiology: Dg Chest 1 View  11/11/2014   CLINICAL DATA:  Headache and weakness this morning.  EXAM: CHEST  1 VIEW  COMPARISON:  09/13/2013  FINDINGS: Lungs are adequately inflated without consolidation or effusion. Cardiomediastinal silhouette is within normal. There is mild calcified plaque over the aortic arch. Mild degenerative change of the spine.  IMPRESSION: No active disease.   Electronically Signed   By: Marin Olp M.D.   On: 11/11/2014 11:46   Ct Head Wo Contrast  11/11/2014   CLINICAL DATA:  Persistent headache for 2 days. Seven of out of 10 pain.  EXAM: CT HEAD WITHOUT CONTRAST  TECHNIQUE: Contiguous axial images were obtained from the base of the skull through the vertex without intravenous contrast.  COMPARISON:  None.  FINDINGS: There is a large high-density extra-axial fluid collection along the entirety of the left cerebral hemisphere measuring 12 mm in depth. There is a fluid fluid level anteriorly within this extra-axial fluid collection consistent with hematocrit effect. There is mass effect from this fluid collection with 15 mm of rightward midline shift. There is some entrapment of the right ventricle with prominence of the temporal horn of the right ventricle. The quadrigeminal plate cistern is effaced on the right. The fourth ventricle is patent. The left lateral ventricle is mildly compressed.  There is no acute intraparenchymal hemorrhage. Intraventricular hemorrhage. No evidence of acute cortical infarction. No skull fracture. Paranasal sinuses clear per  IMPRESSION: 1. Large acute left subdural hematoma extending along the entirety of the left cerebral convexity. 2. Significant mass effect with 15 mm rightward shift, entrapment of the right ventricle, and effacement of  the right quadrigeminal plate cistern. 3. Fourth ventricle is patent. Critical Value/emergent results were called by telephone at the time of interpretation on 11/11/2014 at 12:19 pm to Dr. Delos Haring , who verbally acknowledged these results.   Electronically Signed   By: Suzy Bouchard M.D.   On: 11/11/2014 12:22     Assessment/Plan:  Left subacute subdural hematoma. He denies history of trauma but his headaches started over a week ago. He is not in extremis and he recently 14. I do not think that surgery to evacuate this is emergent or urgent. But I do feel that should be done within 24 hours. Since he recently ate we will put him on the operative schedule for first thing tomorrow morning. We will watch him in the ICU overnight. I have spoken at length to he and his wife regarding the condition and the treatment options. I think his best option is a left craniotomy (I prefer this over burr holes in this instance ) for evacuation of the subacute subdural hematoma. I have described the surgery to them as best that I can. I tried  to answer all of their questions. They understand the risk of the surgery include but are not limited to bleeding, infection, CSF leak, brain injury, stroke, loss of strength or sensation or vision , need for further surgery, lack of relief of symptoms, worsening symptoms, and anesthesia risk including DVT pneumonia MI and death. They agree to proceed.   Renae Mottley S 11/11/2014 1:12 PM

## 2014-11-11 NOTE — ED Provider Notes (Signed)
CSN: [id number]     Arrival date & time 11/11/14  1027 History   First MD Initiated Contact with Patient 11/11/14 1043     Chief Complaint  Patient presents with  . Headache     (Consider location/radiation/quality/duration/timing/severity/associated sxs/prior Treatment) HPI    PCP: Lanette Hampshire, MD Blood pressure 157/79, pulse 76, temperature 98.1 F (36.7 C), temperature source Oral, resp. rate 16, height 6\' 3"  (1.905 m), weight 301 lb (136.533 kg), SpO2 95 %.  ADRIENE PADULA is a 69 y.o.male with a significant PMH of hypertension presents to the ER with complaints of headache for the past three days. He is brought in by his sister. He reports headache that is mild, frontal and "top of head", with complaints that his gait feels unsteady. His sister denies appreciating a change in his walking. The patient is slow to respond to questions (sister says this is different), he denies having a history of headaches or CVA. He describes the headache as dull and constant. The patient is sleepy during the interview and falls asleep during questioning.  Negative Review of Symptoms: Denies alcohol or drug use. Denies cough, dysuria, abdominal pain, change in vision, chest pain, back pain, fevers, weakness. He reports not sleeping much last night and feeling tired.   Past Medical History  Diagnosis Date  . High blood pressure     Cholesterol   Past Surgical History  Procedure Laterality Date  . Foot surgery    . Back surgery  1999  . Colon surgery  2009  . Knee surgery  3.20.2012    knee replacement LEFT    . Knee surgery      RIGHT knee replacement   No family history on file. History  Substance Use Topics  . Smoking status: Never Smoker   . Smokeless tobacco: Not on file  . Alcohol Use: No    Review of Systems  10 Systems reviewed and are negative for acute change except as noted in the HPI.     Allergies  Other and Shrimp  Home Medications   Prior to Admission  medications   Medication Sig Start Date End Date Taking? Authorizing Provider  albuterol (PROVENTIL HFA;VENTOLIN HFA) 108 (90 BASE) MCG/ACT inhaler Inhale 1-2 puffs into the lungs every 6 (six) hours as needed for wheezing or shortness of breath.   Yes Historical Provider, MD  amLODipine (NORVASC) 10 MG tablet Take 10 mg by mouth daily.  11/29/10  Yes Historical Provider, MD  CRESTOR 20 MG tablet Take 20 mg by mouth every evening.  11/22/10  Yes Historical Provider, MD  hydrochlorothiazide 25 MG tablet Take 25 mg by mouth daily.  11/29/10  Yes Historical Provider, MD  KLOR-CON M20 20 MEQ tablet Take 20 mEq by mouth daily as needed (potassium).  10/18/10  Yes Historical Provider, MD  omeprazole (PRILOSEC) 20 MG capsule Take 20 mg by mouth daily.  12/25/10  Yes Historical Provider, MD  allopurinol (ZYLOPRIM) 300 MG tablet Take 300 mg by mouth daily as needed (gout).  11/29/10   Historical Provider, MD  fexofenadine (ALLEGRA ALLERGY) 180 MG tablet Take 180 mg by mouth.      Historical Provider, MD  gabapentin (NEURONTIN) 100 MG capsule Take 1 capsule (100 mg total) by mouth daily. 01/09/12 01/08/13  Carole Civil, MD   BP 156/75 mmHg  Pulse 65  Temp(Src) 98.1 F (36.7 C) (Oral)  Resp 13  Ht 6\' 3"  (1.905 m)  Wt 301 lb (136.533 kg)  BMI  37.62 kg/m2  SpO2 98% Physical Exam  Constitutional: He is oriented to person, place, and time. He appears well-developed and well-nourished. He appears lethargic. He is easily aroused. No distress.  obese  HENT:  Head: Normocephalic and atraumatic.  Eyes: Pupils are equal, round, and reactive to light. Right eye exhibits normal extraocular motion and no nystagmus. Left eye exhibits normal extraocular motion and no nystagmus.  Neck: Normal range of motion. Neck supple. No spinous process tenderness and no muscular tenderness present.  Cardiovascular: Normal rate and regular rhythm.   Pulmonary/Chest: Effort normal and breath sounds normal.  Abdominal: Soft. Bowel  sounds are normal. He exhibits no distension.  Neurological: He is oriented to person, place, and time and easily aroused. He appears lethargic.  Cranial nerves II-VIII and X-XII evaluated and show no deficits. Upper and lower extremity strength is symmetrical and physiologic Normal muscular tone No facial droop  The patient was able to follow commands to display coordination, limb ataxia,rapid alternating movements, no pronator, finger-nose-finger but he does this slowly, has to focus really hard to complete these tasks.  Skin: Skin is warm and dry.  Nursing note and vitals reviewed.   ED Course  Procedures (including critical care time) Labs Review Labs Reviewed  COMPREHENSIVE METABOLIC PANEL - Abnormal; Notable for the following:    Potassium 3.1 (*)    Glucose, Bld 107 (*)    All other components within normal limits  CBC  URINALYSIS, ROUTINE W REFLEX MICROSCOPIC  PROTIME-INR  APTT  I-STAT TROPOININ, ED  I-STAT CG4 LACTIC ACID, ED    Imaging Review Dg Chest 1 View  11/11/2014   CLINICAL DATA:  Headache and weakness this morning.  EXAM: CHEST  1 VIEW  COMPARISON:  09/13/2013  FINDINGS: Lungs are adequately inflated without consolidation or effusion. Cardiomediastinal silhouette is within normal. There is mild calcified plaque over the aortic arch. Mild degenerative change of the spine.  IMPRESSION: No active disease.   Electronically Signed   By: Marin Olp M.D.   On: 11/11/2014 11:46   Ct Head Wo Contrast  11/11/2014   CLINICAL DATA:  Persistent headache for 2 days. Seven of out of 10 pain.  EXAM: CT HEAD WITHOUT CONTRAST  TECHNIQUE: Contiguous axial images were obtained from the base of the skull through the vertex without intravenous contrast.  COMPARISON:  None.  FINDINGS: There is a large high-density extra-axial fluid collection along the entirety of the left cerebral hemisphere measuring 12 mm in depth. There is a fluid fluid level anteriorly within this extra-axial fluid  collection consistent with hematocrit effect. There is mass effect from this fluid collection with 15 mm of rightward midline shift. There is some entrapment of the right ventricle with prominence of the temporal horn of the right ventricle. The quadrigeminal plate cistern is effaced on the right. The fourth ventricle is patent. The left lateral ventricle is mildly compressed.  There is no acute intraparenchymal hemorrhage. Intraventricular hemorrhage. No evidence of acute cortical infarction. No skull fracture. Paranasal sinuses clear per  IMPRESSION: 1. Large acute left subdural hematoma extending along the entirety of the left cerebral convexity. 2. Significant mass effect with 15 mm rightward shift, entrapment of the right ventricle, and effacement of the right quadrigeminal plate cistern. 3. Fourth ventricle is patent. Critical Value/emergent results were called by telephone at the time of interpretation on 11/11/2014 at 12:19 pm to Dr. Delos Haring , who verbally acknowledged these results.   Electronically Signed   By: Suzy Bouchard  M.D.   On: 11/11/2014 12:22     EKG Interpretation None      MDM   Final diagnoses:  Confusion  Subdural hematoma    CRITICAL CARE Performed by: Linus Mako Total critical care time: 120 minutes Critical care time was exclusive of separately billable procedures and treating other patients. Critical care was necessary to treat or prevent imminent or life-threatening deterioration. Critical care was time spent personally by me on the following activities: development of treatment plan with patient and/or surrogate as well as nursing, discussions with consultants, evaluation of patient's response to treatment, examination of patient, obtaining history from patient or surrogate, ordering and performing treatments and interventions, ordering and review of laboratory studies, ordering and review of radiographic studies, pulse oximetry and re-evaluation of  patient's condition.  1;00 pm Dr. Ronnald Ramp has been in to see patient and will admit to the neuro ICU for surgery in the morning to manage subdural bleed. Pt continues to deny any fall or trauma that he can remember.  Filed Vitals:   11/11/14 1230  BP: 156/75  Pulse: 65  Temp:   Resp: [address], PA-C 11/11/14 [place], DO 11/12/14 1546

## 2014-11-11 NOTE — ED Notes (Signed)
Neurosurgery and EDP at bedside.

## 2014-11-11 NOTE — Progress Notes (Signed)
Patient unable to tell me where he is or why he is here along with the year. He was previously able to communicate these orientation questions. Pupils equal round and reaction. These findings called to the Dr. Arnoldo Morale. No new orders at this time. Will continue to monitor closely.

## 2014-11-11 NOTE — ED Notes (Signed)
Pt presents to department for evaluation of headache. Ongoing intermittently x2 days. 7/10 pain. Denies nausea/vomiting. Denies blurred vision. Pt is alert and oriented x4.

## 2014-11-12 ENCOUNTER — Inpatient Hospital Stay (HOSPITAL_COMMUNITY): Payer: Medicare Other | Admitting: Anesthesiology

## 2014-11-12 ENCOUNTER — Encounter (HOSPITAL_COMMUNITY): Payer: Self-pay | Admitting: Certified Registered Nurse Anesthetist

## 2014-11-12 ENCOUNTER — Encounter (HOSPITAL_COMMUNITY)
Admission: EM | Disposition: A | Discharge status: Home Health | Payer: Self-pay | Source: Home / Self Care | Attending: Neurological Surgery

## 2014-11-12 DIAGNOSIS — Z9889 Other specified postprocedural states: Secondary | ICD-10-CM

## 2014-11-12 HISTORY — PX: CRANIOTOMY: SHX93

## 2014-11-12 SURGERY — CRANIOTOMY HEMATOMA EVACUATION SUBDURAL
Anesthesia: General | Site: Head | Laterality: Left

## 2014-11-12 MED ORDER — STERILE WATER FOR INJECTION IJ SOLN
INTRAMUSCULAR | Status: AC
  Filled 2014-11-12: qty 10

## 2014-11-12 MED ORDER — ARTIFICIAL TEARS OP OINT
TOPICAL_OINTMENT | OPHTHALMIC | Status: AC
  Filled 2014-11-12: qty 3.5

## 2014-11-12 MED ORDER — ONDANSETRON HCL 4 MG/2ML IJ SOLN: 4.0000 mg | INTRAMUSCULAR | Status: DC | PRN

## 2014-11-12 MED ORDER — ALBUTEROL SULFATE HFA 108 (90 BASE) MCG/ACT IN AERS
INHALATION_SPRAY | RESPIRATORY_TRACT | Status: AC
  Filled 2014-11-12: qty 6.7

## 2014-11-12 MED ORDER — SODIUM CHLORIDE 0.9 % IV SOLN
INTRAVENOUS | Status: DC | PRN
  Administered 2014-11-12 (×2): via INTRAVENOUS

## 2014-11-12 MED ORDER — GLYCOPYRROLATE 0.2 MG/ML IJ SOLN
INTRAMUSCULAR | Status: DC | PRN
  Administered 2014-11-12: 0.1 mg via INTRAVENOUS
  Administered 2014-11-12: .8 mg via INTRAVENOUS

## 2014-11-12 MED ORDER — PROPOFOL 10 MG/ML IV BOLUS
INTRAVENOUS | Status: DC | PRN
  Administered 2014-11-12: 40 mg via INTRAVENOUS
  Administered 2014-11-12: 200 mg via INTRAVENOUS

## 2014-11-12 MED ORDER — LIDOCAINE HCL (CARDIAC) 20 MG/ML IV SOLN
INTRAVENOUS | Status: DC | PRN
  Administered 2014-11-12: 80 mg via INTRAVENOUS

## 2014-11-12 MED ORDER — ROCURONIUM BROMIDE 50 MG/5ML IV SOLN
INTRAVENOUS | Status: AC
  Filled 2014-11-12: qty 1

## 2014-11-12 MED ORDER — PHENYLEPHRINE 40 MCG/ML (10ML) SYRINGE FOR IV PUSH (FOR BLOOD PRESSURE SUPPORT)
PREFILLED_SYRINGE | INTRAVENOUS | Status: AC
  Filled 2014-11-12: qty 10

## 2014-11-12 MED ORDER — ROCURONIUM BROMIDE 50 MG/5ML IV SOLN
INTRAVENOUS | Status: AC
  Filled 2014-11-12: qty 2

## 2014-11-12 MED ORDER — HYDROCODONE-ACETAMINOPHEN 5-325 MG PO TABS
1.0000 | ORAL_TABLET | ORAL | Status: DC | PRN
  Administered 2014-11-13 – 2014-11-17 (×5): 1 via ORAL
  Filled 2014-11-12 (×5): qty 1

## 2014-11-12 MED ORDER — ACETAMINOPHEN 325 MG PO TABS
650.0000 mg | ORAL_TABLET | ORAL | Status: DC | PRN
  Administered 2014-11-14 – 2014-11-16 (×3): 650 mg via ORAL
  Filled 2014-11-12 (×3): qty 2

## 2014-11-12 MED ORDER — GLYCOPYRROLATE 0.2 MG/ML IJ SOLN
INTRAMUSCULAR | Status: AC
  Filled 2014-11-12: qty 1

## 2014-11-12 MED ORDER — ROCURONIUM BROMIDE 100 MG/10ML IV SOLN
INTRAVENOUS | Status: DC | PRN
  Administered 2014-11-12: 50 mg via INTRAVENOUS
  Administered 2014-11-12: 20 mg via INTRAVENOUS

## 2014-11-12 MED ORDER — GLYCOPYRROLATE 0.2 MG/ML IJ SOLN
INTRAMUSCULAR | Status: AC
  Filled 2014-11-12: qty 5

## 2014-11-12 MED ORDER — 0.9 % SODIUM CHLORIDE (POUR BTL) OPTIME
TOPICAL | Status: DC | PRN
  Administered 2014-11-12 (×4): 1000 mL

## 2014-11-12 MED ORDER — FENTANYL CITRATE 0.05 MG/ML IJ SOLN
INTRAMUSCULAR | Status: AC
  Filled 2014-11-12: qty 5

## 2014-11-12 MED ORDER — MIDAZOLAM HCL 2 MG/2ML IJ SOLN
INTRAMUSCULAR | Status: AC
  Filled 2014-11-12: qty 2

## 2014-11-12 MED ORDER — THROMBIN 20000 UNITS EX SOLR
CUTANEOUS | Status: DC | PRN
  Administered 2014-11-12: 10:00:00 via TOPICAL

## 2014-11-12 MED ORDER — DEXTROSE 5 % IV SOLN
3.0000 g | INTRAVENOUS | Status: DC | PRN
  Administered 2014-11-12: 3 g via INTRAVENOUS

## 2014-11-12 MED ORDER — MICROFIBRILLAR COLL HEMOSTAT EX PADS
MEDICATED_PAD | CUTANEOUS | Status: DC | PRN
  Administered 2014-11-12: 1 via TOPICAL

## 2014-11-12 MED ORDER — EPHEDRINE SULFATE 50 MG/ML IJ SOLN
INTRAMUSCULAR | Status: AC
Start: 2014-11-12 — End: 2014-11-12
  Filled 2014-11-12: qty 1

## 2014-11-12 MED ORDER — CEFAZOLIN SODIUM-DEXTROSE 2-3 GM-% IV SOLR
2.0000 g | Freq: Three times a day (TID) | INTRAVENOUS | Status: AC
  Administered 2014-11-12 – 2014-11-13 (×2): 2 g via INTRAVENOUS
  Filled 2014-11-12 (×2): qty 50

## 2014-11-12 MED ORDER — SUCCINYLCHOLINE CHLORIDE 20 MG/ML IJ SOLN
INTRAMUSCULAR | Status: AC
  Filled 2014-11-12: qty 1

## 2014-11-12 MED ORDER — PHENYLEPHRINE HCL 10 MG/ML IJ SOLN
INTRAMUSCULAR | Status: AC
  Filled 2014-11-12: qty 1

## 2014-11-12 MED ORDER — PROPOFOL 10 MG/ML IV BOLUS
INTRAVENOUS | Status: AC
  Filled 2014-11-12: qty 20

## 2014-11-12 MED ORDER — EPHEDRINE SULFATE 50 MG/ML IJ SOLN
INTRAMUSCULAR | Status: AC
  Filled 2014-11-12: qty 1

## 2014-11-12 MED ORDER — ACETAMINOPHEN 650 MG RE SUPP: 650.0000 mg | RECTAL | Status: DC | PRN

## 2014-11-12 MED ORDER — SODIUM CHLORIDE 0.9 % IR SOLN
Status: DC | PRN
  Administered 2014-11-12: 10:00:00

## 2014-11-12 MED ORDER — ONDANSETRON HCL 4 MG/2ML IJ SOLN
INTRAMUSCULAR | Status: DC | PRN
  Administered 2014-11-12: 4 mg via INTRAVENOUS

## 2014-11-12 MED ORDER — DEXAMETHASONE SODIUM PHOSPHATE 4 MG/ML IJ SOLN
INTRAMUSCULAR | Status: DC | PRN
  Administered 2014-11-12: 4 mg via INTRAVENOUS

## 2014-11-12 MED ORDER — SURGIFOAM 100 EX MISC
CUTANEOUS | Status: DC | PRN
  Administered 2014-11-12: 10:00:00 via TOPICAL

## 2014-11-12 MED ORDER — PROMETHAZINE HCL 25 MG/ML IJ SOLN: 6.2500 mg | INTRAMUSCULAR | Status: DC | PRN

## 2014-11-12 MED ORDER — ONDANSETRON HCL 4 MG PO TABS
4.0000 mg | ORAL_TABLET | ORAL | Status: DC | PRN
Start: 2014-11-12 — End: 2014-11-17

## 2014-11-12 MED ORDER — LIDOCAINE HCL (CARDIAC) 20 MG/ML IV SOLN
INTRAVENOUS | Status: AC
  Filled 2014-11-12: qty 5

## 2014-11-12 MED ORDER — CEFAZOLIN SODIUM-DEXTROSE 2-3 GM-% IV SOLR
INTRAVENOUS | Status: AC
  Filled 2014-11-12: qty 100

## 2014-11-12 MED ORDER — MORPHINE SULFATE 2 MG/ML IJ SOLN: 1.0000 mg | INTRAMUSCULAR | Status: DC | PRN

## 2014-11-12 MED ORDER — HYDROMORPHONE HCL 1 MG/ML IJ SOLN
0.2500 mg | INTRAMUSCULAR | Status: DC | PRN
  Administered 2014-11-12: 0.5 mg via INTRAVENOUS
  Filled 2014-11-12 (×2): qty 1

## 2014-11-12 MED ORDER — ALBUTEROL SULFATE HFA 108 (90 BASE) MCG/ACT IN AERS
INHALATION_SPRAY | RESPIRATORY_TRACT | Status: DC | PRN
  Administered 2014-11-12: 6 via RESPIRATORY_TRACT

## 2014-11-12 MED ORDER — OXYCODONE HCL 5 MG/5ML PO SOLN: 5.0000 mg | Freq: Once | ORAL | Status: DC | PRN

## 2014-11-12 MED ORDER — LIDOCAINE-EPINEPHRINE 1 %-1:100000 IJ SOLN
INTRAMUSCULAR | Status: DC | PRN
  Administered 2014-11-12: 10 mL

## 2014-11-12 MED ORDER — ESMOLOL HCL 10 MG/ML IV SOLN
INTRAVENOUS | Status: DC | PRN
  Administered 2014-11-12 (×2): 30 mg via INTRAVENOUS

## 2014-11-12 MED ORDER — POTASSIUM CHLORIDE IN NACL 20-0.9 MEQ/L-% IV SOLN
INTRAVENOUS | Status: DC
  Administered 2014-11-12 – 2014-11-13 (×3): via INTRAVENOUS
  Filled 2014-11-12 (×4): qty 1000

## 2014-11-12 MED ORDER — THROMBIN 5000 UNITS EX SOLR
CUTANEOUS | Status: DC | PRN
  Administered 2014-11-12: 10:00:00 via TOPICAL

## 2014-11-12 MED ORDER — LABETALOL HCL 5 MG/ML IV SOLN
10.0000 mg | INTRAVENOUS | Status: DC | PRN
Start: 2014-11-12 — End: 2014-11-14
  Administered 2014-11-13: 20 mg via INTRAVENOUS
  Filled 2014-11-12 (×2): qty 4

## 2014-11-12 MED ORDER — PNEUMOCOCCAL VAC POLYVALENT 25 MCG/0.5ML IJ INJ
0.5000 mL | INJECTION | INTRAMUSCULAR | Status: AC
  Administered 2014-11-13: 0.5 mL via INTRAMUSCULAR
  Filled 2014-11-12: qty 0.5

## 2014-11-12 MED ORDER — NEOSTIGMINE METHYLSULFATE 10 MG/10ML IV SOLN
INTRAVENOUS | Status: DC | PRN
  Administered 2014-11-12: 5 mg via INTRAVENOUS

## 2014-11-12 MED ORDER — SODIUM CHLORIDE 0.9 % IV SOLN
500.0000 mg | Freq: Two times a day (BID) | INTRAVENOUS | Status: DC
  Administered 2014-11-12 – 2014-11-14 (×4): 500 mg via INTRAVENOUS
  Filled 2014-11-12 (×6): qty 5

## 2014-11-12 MED ORDER — PANTOPRAZOLE SODIUM 40 MG IV SOLR
40.0000 mg | Freq: Every day | INTRAVENOUS | Status: DC
  Administered 2014-11-12 – 2014-11-14 (×3): 40 mg via INTRAVENOUS
  Filled 2014-11-12 (×5): qty 40

## 2014-11-12 MED ORDER — ONDANSETRON HCL 4 MG/2ML IJ SOLN
INTRAMUSCULAR | Status: AC
  Filled 2014-11-12: qty 2

## 2014-11-12 MED ORDER — DOCUSATE SODIUM 100 MG PO CAPS
100.0000 mg | ORAL_CAPSULE | Freq: Two times a day (BID) | ORAL | Status: DC
  Administered 2014-11-12 – 2014-11-17 (×10): 100 mg via ORAL
  Filled 2014-11-12 (×13): qty 1

## 2014-11-12 MED ORDER — OXYCODONE HCL 5 MG PO TABS: 5.0000 mg | ORAL_TABLET | Freq: Once | ORAL | Status: DC | PRN

## 2014-11-12 MED ORDER — FENTANYL CITRATE 0.05 MG/ML IJ SOLN
INTRAMUSCULAR | Status: DC | PRN
  Administered 2014-11-12 (×2): 100 ug via INTRAVENOUS
  Administered 2014-11-12: 50 ug via INTRAVENOUS
  Administered 2014-11-12: 150 ug via INTRAVENOUS

## 2014-11-12 SURGICAL SUPPLY — 63 items
BAG DECANTER FOR FLEXI CONT (MISCELLANEOUS) ×3 IMPLANT
BUR ROUTER D-58 CRANI (BURR) ×3 IMPLANT
CANISTER SUCT 3000ML PPV (MISCELLANEOUS) ×3 IMPLANT
CATH VENTRIC 35X38 W/TROCAR LG (CATHETERS) ×2 IMPLANT
CLIP TI MEDIUM 6 (CLIP) IMPLANT
CONT SPEC 4OZ CLIKSEAL STRL BL (MISCELLANEOUS) ×3 IMPLANT
DRAIN SNY WOU 7FLT (WOUND CARE) ×2 IMPLANT
DRAPE MICROSCOPE LEICA (MISCELLANEOUS) IMPLANT
DRAPE NEUROLOGICAL W/INCISE (DRAPES) ×3 IMPLANT
DRAPE SURG 17X23 STRL (DRAPES) IMPLANT
DRAPE WARM FLUID 44X44 (DRAPE) ×3 IMPLANT
DRSG TELFA 3X8 NADH (GAUZE/BANDAGES/DRESSINGS) IMPLANT
DURAPREP 26ML APPLICATOR (WOUND CARE) ×2 IMPLANT
DURAPREP 6ML APPLICATOR 50/CS (WOUND CARE) ×3 IMPLANT
ELECT CAUTERY BLADE 6.4 (BLADE) ×3 IMPLANT
ELECT REM PT RETURN 9FT ADLT (ELECTROSURGICAL) ×3
ELECTRODE REM PT RTRN 9FT ADLT (ELECTROSURGICAL) ×1 IMPLANT
EVACUATOR SILICONE 100CC (DRAIN) ×2 IMPLANT
GAUZE SPONGE 4X4 12PLY STRL (GAUZE/BANDAGES/DRESSINGS) ×3 IMPLANT
GAUZE SPONGE 4X4 16PLY XRAY LF (GAUZE/BANDAGES/DRESSINGS) IMPLANT
GLOVE BIO SURGEON STRL SZ8 (GLOVE) ×1 IMPLANT
GLOVE SURG SS PI 7.0 STRL IVOR (GLOVE) ×8 IMPLANT
GLOVE SURG SS PI 8.0 STRL IVOR (GLOVE) ×8 IMPLANT
GLOVE SURG SS PI 8.5 STRL IVOR (GLOVE) ×8
GLOVE SURG SS PI 8.5 STRL STRW (GLOVE) IMPLANT
GOWN STRL REUS W/ TWL LRG LVL3 (GOWN DISPOSABLE) IMPLANT
GOWN STRL REUS W/ TWL XL LVL3 (GOWN DISPOSABLE) IMPLANT
GOWN STRL REUS W/TWL 2XL LVL3 (GOWN DISPOSABLE) ×3 IMPLANT
GOWN STRL REUS W/TWL LRG LVL3 (GOWN DISPOSABLE)
GOWN STRL REUS W/TWL XL LVL3 (GOWN DISPOSABLE) ×6
HEMOSTAT POWDER KIT SURGIFOAM (HEMOSTASIS) ×2 IMPLANT
KIT BASIN OR (CUSTOM PROCEDURE TRAY) ×3 IMPLANT
KIT DRAIN CSF ACCUDRAIN (MISCELLANEOUS) ×2 IMPLANT
KIT ROOM TURNOVER OR (KITS) ×3 IMPLANT
NEEDLE HYPO 22GX1.5 SAFETY (NEEDLE) ×3 IMPLANT
NS IRRIG 1000ML POUR BTL (IV SOLUTION) ×3 IMPLANT
PACK CRANIOTOMY (CUSTOM PROCEDURE TRAY) ×3 IMPLANT
PAD ARMBOARD 7.5X6 YLW CONV (MISCELLANEOUS) ×3 IMPLANT
PAD DRESSING TELFA 3X8 NADH (GAUZE/BANDAGES/DRESSINGS) IMPLANT
PATTIES SURGICAL .25X.25 (GAUZE/BANDAGES/DRESSINGS) IMPLANT
PATTIES SURGICAL .5 X.5 (GAUZE/BANDAGES/DRESSINGS) IMPLANT
PATTIES SURGICAL .5 X3 (DISPOSABLE) IMPLANT
PATTIES SURGICAL 1X1 (DISPOSABLE) IMPLANT
PERFORATOR LRG  14-11MM (BIT) ×2
PERFORATOR LRG 14-11MM (BIT) ×1 IMPLANT
PIN MAYFIELD SKULL DISP (PIN) ×2 IMPLANT
PLATE 1.5  2HOLE LNG NEURO (Plate) ×8 IMPLANT
PLATE 1.5 2HOLE LNG NEURO (Plate) IMPLANT
RUBBERBAND STERILE (MISCELLANEOUS) IMPLANT
SCREW SELF DRILL HT 1.5/4MM (Screw) ×16 IMPLANT
SPONGE NEURO XRAY DETECT 1X3 (DISPOSABLE) IMPLANT
SPONGE SURGIFOAM ABS GEL 100 (HEMOSTASIS) ×3 IMPLANT
STAPLER VISISTAT 35W (STAPLE) ×3 IMPLANT
SUT ETHILON 3 0 FSL (SUTURE) IMPLANT
SUT NURALON 4 0 TR CR/8 (SUTURE) ×6 IMPLANT
SUT VIC AB 2-0 CP2 18 (SUTURE) ×5 IMPLANT
SUT VIC AB 3-0 CP2 18 (SUTURE) ×2 IMPLANT
SYR CONTROL 10ML LL (SYRINGE) ×3 IMPLANT
TAPE CLOTH SURG 4X10 WHT LF (GAUZE/BANDAGES/DRESSINGS) ×2 IMPLANT
TOWEL OR 17X24 6PK STRL BLUE (TOWEL DISPOSABLE) ×3 IMPLANT
TOWEL OR 17X26 10 PK STRL BLUE (TOWEL DISPOSABLE) ×3 IMPLANT
UNDERPAD 30X30 INCONTINENT (UNDERPADS AND DIAPERS) ×2 IMPLANT
WATER STERILE IRR 1000ML POUR (IV SOLUTION) ×3 IMPLANT

## 2014-11-12 NOTE — Anesthesia Preprocedure Evaluation (Signed)
Anesthesia Evaluation    Reviewed: Allergy & Precautions, NPO status , Patient's Chart, lab work & pertinent test results  Airway        Dental   Pulmonary neg pulmonary ROS,          Cardiovascular hypertension, Pt. on medications negative cardio ROS      Neuro/Psych negative psych ROS   GI/Hepatic negative GI ROS, Neg liver ROS,   Endo/Other  negative endocrine ROS  Renal/GU negative Renal ROS  negative genitourinary   Musculoskeletal  (+) Arthritis -,   Abdominal   Peds negative pediatric ROS (+)  Hematology negative hematology ROS (+)   Anesthesia Other Findings   Reproductive/Obstetrics negative OB ROS                             Anesthesia Physical Anesthesia Plan  ASA: III and emergent  Anesthesia Plan:    Post-op Pain Management:    Induction: Intravenous  Airway Management Planned: Oral ETT  Additional Equipment:   Intra-op Plan:   Post-operative Plan: Extubation in OR  Informed Consent:   Plan Discussed with:   Anesthesia Plan Comments:         Anesthesia Quick Evaluation

## 2014-11-12 NOTE — Addendum Note (Signed)
Addendum  created 11/12/14 1157 by Ollen Bowl, CRNA   Modules edited: Anesthesia Medication Administration

## 2014-11-12 NOTE — Anesthesia Procedure Notes (Signed)
Procedure Name: Intubation Date/Time: 11/12/2014 8:42 AM Performed by: Ollen Bowl Pre-anesthesia Checklist: Patient identified, Emergency Drugs available, Suction available, Patient being monitored and Timeout performed Patient Re-evaluated:Patient Re-evaluated prior to inductionOxygen Delivery Method: Circle system utilized and Simple face mask Preoxygenation: Pre-oxygenation with 100% oxygen Intubation Type: IV induction Ventilation: Mask ventilation without difficulty and Oral airway inserted - appropriate to patient size Laryngoscope Size: Mac and 4 Grade View: Grade I Tube type: Subglottic suction tube Tube size: 7.5 mm Number of attempts: 1 Airway Equipment and Method: Patient positioned with wedge pillow and Stylet Placement Confirmation: ETT inserted through vocal cords under direct vision,  positive ETCO2 and breath sounds checked- equal and bilateral Secured at: 22 cm Tube secured with: Tape Dental Injury: Teeth and Oropharynx as per pre-operative assessment

## 2014-11-12 NOTE — Op Note (Signed)
11/11/2014 - 11/12/2014  10:56 AM  PATIENT:  Devin Callahan  69 y.o. male  PRE-OPERATIVE DIAGNOSIS:  Left subacute subdural hematoma  POST-OPERATIVE DIAGNOSIS:  Same, probable incidental small left frontal meningioma  PROCEDURE:  Left frontoparietal craniotomy for evacuation of subacute subdural hematoma and resection of small dural based lesion  SURGEON:  Sherley Bounds, MD  ASSISTANTS: Dr. Arnoldo Morale  ANESTHESIA:   General  EBL: 150 ml  Total I/O In: 1700 [I.V.:1700] Out: 1250 [Urine:1100; Blood:150]  BLOOD ADMINISTERED:none  DRAINS: Both subdural and subgaleal drains were placed   SPECIMEN:  Excision of left frontal dural based lesion  INDICATION FOR PROCEDURE: This patient presented yesterday with a greater than 1 week history of progressive headaches. CT scan showed a large left subacute subdural hematoma. An craniotomy for evacuation of the subdural hematoma. Patient understood the risks, benefits, and alternatives and potential outcomes and wished to proceed.  PROCEDURE DETAILS: The patient was taken to the operating room and after induction of adequate generalized endotracheal anesthesia, the head was affixed in a 3 point Mayfield head rest, and turned to the right to expose the left frontotemporal parietal region. The head was shaved completely at his request and then cleaned with a prep solution and then prepped with DuraPrep and draped in the usual sterile fashion. 10 cc of local anesthetic was injected, and a hockey-stick incision was made on the left of the head. Raney clips were placed to establish hemostasis of the scalp, the muscle was reflected with the scalp flap, to expose the left frontoparietal region. 4 small burr holes were placed, and a craniotomy flap was turned utilizing the high-speed, air powered drill. The flap was then placed in bacitracin-containing saline solution, and the dura was opened to expose a large left subacute hematoma.  once the dural flap was reflected  frontally a small 3 cm but flat dural based lesion grossly consistent with meningioma was identified. Dissection of dura was then resected utilizing Metzenbaum scissors and this was sent for permanent pathology. A hematoma was then removed with a combination of irrigation and suction. the drains were broken up. No specific bleeding point was identified. I continued to irrigate until the irrigant was clear to, and dried any bleeding with bipolar cautery. we placed dural tack up sutures superiorly. I then placed a subdural drain through separate stab incision and close the dura with a running 4-0 Nurolon suture. Dural tack up sutures were placed. The dura was lined with Gelfoam, and the craniotomy flap was replaced with doggie-bone plates. The wound was copiously irrigated. A subgaleal drain was placed, and the galea was then closed with interrupted 2-0 Vicryl suture. The skin was then closed with staples a sterile dressing was applied. The patient was then taken out of the 3-point Mayfield headrest and awakened from general anesthesia, and transported to the recovery room in stable condition. At the end of the procedure all sponge, needle, and instrument counts were correct.   PLAN OF CARE: Admit to inpatient   PATIENT DISPOSITION:  ICU - extubated and stable.   Delay start of Pharmacological VTE agent (>24hrs) due to surgical blood loss or risk of bleeding:  yes

## 2014-11-12 NOTE — Progress Notes (Signed)
Looks really good post-op. Watching TV. No aphasia. MAEx4. Dressing dry.

## 2014-11-12 NOTE — Transfer of Care (Signed)
Immediate Anesthesia Transfer of Care Note  Patient: Devin Callahan  Procedure(s) Performed: Procedure(s): LEFT SIDED CRANIOTOMY FOR EVACUATION OF SUBDURAL HEMATOMA (Left)  Patient Location: PACU and NICU  Anesthesia Type:General  Level of Consciousness: awake  Airway & Oxygen Therapy: Patient Spontanous Breathing and Patient connected to face mask oxygen  Post-op Assessment: Report given to RN and Post -op Vital signs reviewed and stable  Post vital signs: Reviewed and stable  Last Vitals:  Filed Vitals:   11/12/14 1119  BP: 139/72  Pulse: 70  Temp: 36.7 C  Resp:     Complications: No apparent anesthesia complications

## 2014-11-12 NOTE — Anesthesia Postprocedure Evaluation (Signed)
Anesthesia Post Note  Patient: Devin Callahan  Procedure(s) Performed: Procedure(s) (LRB): LEFT SIDED CRANIOTOMY FOR EVACUATION OF SUBDURAL HEMATOMA (Left)  Anesthesia type: general  Patient location: Neuro ICU  Post pain: Pain level controlled  Post assessment: Patient's Cardiovascular Status Stable  Last Vitals:  Filed Vitals:   11/12/14 1145  BP:   Pulse: 64  Temp:   Resp: 9    Post vital signs: Reviewed and stable  Level of consciousness: sedated  Complications: No apparent anesthesia complications

## 2014-11-13 ENCOUNTER — Inpatient Hospital Stay (HOSPITAL_COMMUNITY): Payer: Medicare Other

## 2014-11-13 DIAGNOSIS — I6200 Nontraumatic subdural hemorrhage, unspecified: Secondary | ICD-10-CM | POA: Diagnosis not present

## 2014-11-13 NOTE — Progress Notes (Signed)
Subjective: Patient reports he is doing well. Minimal headache. No numbness tingling or weakness.  Objective: Vital signs in last 24 hours: Temp:  [97.4 F (36.3 C)-98.7 F (37.1 C)] 97.7 F (36.5 C) (04/10 0800) Pulse Rate:  [54-95] 67 (04/10 0900) Resp:  [9-20] 16 (04/10 0900) BP: (119-159)/(61-96) 129/62 mmHg (04/10 0900) SpO2:  [91 %-100 %] 98 % (04/10 0900) Arterial Line BP: (166-213)/(65-83) 173/77 mmHg (04/10 0900)  Intake/Output from previous day: 04/09 0730 - 04/10 0729 In: 3360 [I.V.:3050; IV Piggyback:310] Out: 2813 [Urine:2325; Drains:338; Blood:150] Intake/Output this shift: Total I/O In: 345 [P.O.:120; I.V.:225] Out: 317 [Urine:300; Drains:17]  Neurologic: Grossly normal, he is sitting up in bed watching TV, he is moving all extremities strongly, he is awake and conversant  Lab Results: Lab Results  Component Value Date   WBC 8.6 11/11/2014   HGB 13.7 11/11/2014   HCT 41.7 11/11/2014   MCV 87.1 11/11/2014   PLT 183 11/11/2014   Lab Results  Component Value Date   INR 1.08 11/11/2014   BMET Lab Results  Component Value Date   NA 140 11/11/2014   K 3.1* 11/11/2014   CL 100 11/11/2014   CO2 35* 11/11/2014   GLUCOSE 97 11/11/2014   BUN 7 11/11/2014   CREATININE 0.75 11/11/2014   CALCIUM 8.9 11/11/2014    Studies/Results: Dg Chest 1 View  11/11/2014   CLINICAL DATA:  Headache and weakness this morning.  EXAM: CHEST  1 VIEW  COMPARISON:  09/13/2013  FINDINGS: Lungs are adequately inflated without consolidation or effusion. Cardiomediastinal silhouette is within normal. There is mild calcified plaque over the aortic arch. Mild degenerative change of the spine.  IMPRESSION: No active disease.   Electronically Signed   By: Marin Olp M.D.   On: 11/11/2014 11:46   Ct Head Wo Contrast  11/13/2014   CLINICAL DATA:  69 year old male with subdural hematoma post drainage. Subsequent encounter.  EXAM: CT HEAD WITHOUT CONTRAST  TECHNIQUE: Contiguous axial  images were obtained from the base of the skull through the vertex without intravenous contrast.  COMPARISON:  11/11/2014.  FINDINGS: This exam was interpreted during a PACS downtime with limited availability of comparison cases.  Post left craniotomy for drainage of left-sided subdural hematoma with drain in place. Decrease in size of left-sided subdural collection with residual left-sided subdural collection having maximal thickness of 10.8 mm versus prior 14.7 mm. Small amount of pneumocephalus.  Decrease in mass effect upon the left lateral ventricle and decreased midline shift. Current right midline shift of 8.1 mm versus prior 14.2 mm.  No CT evidence of large acute infarct.  No intracranial mass lesion noted on this unenhanced exam.  Polypoid opacification inferior aspect left maxillary sinus. Minimal mucosal thickening left frontal sinus.  Remote right zygomatic arch fracture.  Exophthalmos.  IMPRESSION: Post left craniotomy for drainage of left-sided subdural hematoma with drain in place. Decrease in size of left-sided subdural collection with residual left-sided subdural collection having maximal thickness of 10.8 mm versus prior 14.7 mm. Small amount of pneumocephalus.  Decrease in mass effect upon the left lateral ventricle and decreased midline shift. Current right midline shift of 8.1 mm versus prior 14.2 mm.   Electronically Signed   By: Genia Del M.D.   On: 11/13/2014 08:45   Ct Head Wo Contrast  11/11/2014   CLINICAL DATA:  Persistent headache for 2 days. Seven of out of 10 pain.  EXAM: CT HEAD WITHOUT CONTRAST  TECHNIQUE: Contiguous axial images were obtained from the  base of the skull through the vertex without intravenous contrast.  COMPARISON:  None.  FINDINGS: There is a large high-density extra-axial fluid collection along the entirety of the left cerebral hemisphere measuring 12 mm in depth. There is a fluid fluid level anteriorly within this extra-axial fluid collection consistent with  hematocrit effect. There is mass effect from this fluid collection with 15 mm of rightward midline shift. There is some entrapment of the right ventricle with prominence of the temporal horn of the right ventricle. The quadrigeminal plate cistern is effaced on the right. The fourth ventricle is patent. The left lateral ventricle is mildly compressed.  There is no acute intraparenchymal hemorrhage. Intraventricular hemorrhage. No evidence of acute cortical infarction. No skull fracture. Paranasal sinuses clear per  IMPRESSION: 1. Large acute left subdural hematoma extending along the entirety of the left cerebral convexity. 2. Significant mass effect with 15 mm rightward shift, entrapment of the right ventricle, and effacement of the right quadrigeminal plate cistern. 3. Fourth ventricle is patent. Critical Value/emergent results were called by telephone at the time of interpretation on 11/11/2014 at 12:19 pm to Dr. Delos Haring , who verbally acknowledged these results.   Electronically Signed   By: Suzy Bouchard M.D.   On: 11/11/2014 12:22    Assessment/Plan: Doing very well, CT head looks much better . We'll continue subdural and subgaleal drain for 1 more day . Remove A-line and Foley catheter. Mobilized today.    LOS: 2 days    JONES,DAVID S 11/13/2014, 10:00 AM

## 2014-11-14 MED ORDER — LEVETIRACETAM 500 MG/5ML IV SOLN
500.0000 mg | Freq: Two times a day (BID) | INTRAVENOUS | Status: DC
  Administered 2014-11-14 – 2014-11-15 (×2): 500 mg via INTRAVENOUS
  Filled 2014-11-14 (×3): qty 5

## 2014-11-14 MED ORDER — POTASSIUM CHLORIDE IN NACL 20-0.9 MEQ/L-% IV SOLN
INTRAVENOUS | Status: DC
  Administered 2014-11-14: 16:00:00 via INTRAVENOUS
  Filled 2014-11-14 (×2): qty 1000

## 2014-11-14 NOTE — Progress Notes (Signed)
UR complete.  Kathia Covington RN, MSN 

## 2014-11-14 NOTE — Progress Notes (Signed)
Pt arrived from [facility], alert and oriented x4, incision OTA and CDI with staples. Oriented to call button. Pt. Currently comfortable. Will monitor.

## 2014-11-14 NOTE — Progress Notes (Signed)
Called and received report from [place] [facility] [place] at [phone].

## 2014-11-14 NOTE — Progress Notes (Signed)
Subjective: Patient reports no headache, no visual changes, no numbness tingling or weakness.  Objective: Vital signs in last 24 hours: Temp:  [97.5 F (36.4 C)-98.8 F (37.1 C)] 97.5 F (36.4 C) (04/11 0400) Pulse Rate:  [57-84] 75 (04/11 0200) Resp:  [13-22] 17 (04/11 0200) BP: (89-139)/(41-103) 119/66 mmHg (04/11 0200) SpO2:  [92 %-100 %] 99 % (04/11 0200) Arterial Line BP: (170-174)/(71-77) 173/77 mmHg (04/10 0900)  Intake/Output from previous day: 04/10 0730 - 04/11 0729 In: 1406.3 [P.O.:120; I.V.:1076.3; IV Piggyback:210] Out: 8101 [Urine:1175; Drains:324] Intake/Output this shift: Total I/O In: 1406.3 [P.O.:120; I.V.:1076.3; IV Piggyback:210] Out: 7510 [Urine:1175; Drains:324]  Neurologic: Grossly normal  Lab Results: Lab Results  Component Value Date   WBC 8.6 11/11/2014   HGB 13.7 11/11/2014   HCT 41.7 11/11/2014   MCV 87.1 11/11/2014   PLT 183 11/11/2014   Lab Results  Component Value Date   INR 1.08 11/11/2014   BMET Lab Results  Component Value Date   NA 140 11/11/2014   K 3.1* 11/11/2014   CL 100 11/11/2014   CO2 35* 11/11/2014   GLUCOSE 97 11/11/2014   BUN 7 11/11/2014   CREATININE 0.75 11/11/2014   CALCIUM 8.9 11/11/2014    Studies/Results: Ct Head Wo Contrast  11/13/2014   CLINICAL DATA:  69 year old male with subdural hematoma post drainage. Subsequent encounter.  EXAM: CT HEAD WITHOUT CONTRAST  TECHNIQUE: Contiguous axial images were obtained from the base of the skull through the vertex without intravenous contrast.  COMPARISON:  11/11/2014.  FINDINGS: This exam was interpreted during a PACS downtime with limited availability of comparison cases.  Post left craniotomy for drainage of left-sided subdural hematoma with drain in place. Decrease in size of left-sided subdural collection with residual left-sided subdural collection having maximal thickness of 10.8 mm versus prior 14.7 mm. Small amount of pneumocephalus.  Decrease in mass effect upon  the left lateral ventricle and decreased midline shift. Current right midline shift of 8.1 mm versus prior 14.2 mm.  No CT evidence of large acute infarct.  No intracranial mass lesion noted on this unenhanced exam.  Polypoid opacification inferior aspect left maxillary sinus. Minimal mucosal thickening left frontal sinus.  Remote right zygomatic arch fracture.  Exophthalmos.  IMPRESSION: Post left craniotomy for drainage of left-sided subdural hematoma with drain in place. Decrease in size of left-sided subdural collection with residual left-sided subdural collection having maximal thickness of 10.8 mm versus prior 14.7 mm. Small amount of pneumocephalus.  Decrease in mass effect upon the left lateral ventricle and decreased midline shift. Current right midline shift of 8.1 mm versus prior 14.2 mm.   Electronically Signed   By: Genia Del M.D.   On: 11/13/2014 08:45    Assessment/Plan: I removed his subdural and subgaleal drains today. We will transfer him to the floor. Mobilize with therapy. Overall he is doing very well.   LOS: 3 days    Dany Harten S 11/14/2014, 6:15 AM

## 2014-11-14 NOTE — Progress Notes (Signed)
CARE MANAGEMENT NOTE 11/14/2014  Patient:  Devin Callahan, Devin Callahan   Account Number:  [id number]  Date Initiated:  11/14/2014  Documentation initiated by:  Lorne Skeens  Subjective/Objective Assessment:   Patient was admitted with left subacute subdural hematoma. Underwent left craniotomy for evacuation. Lives at home with spouse.     Action/Plan:   Will follow for discharge needs pending PT/OT evals and physician orders.   Anticipated DC Date:     Anticipated DC Plan:           Choice offered to / List presented to:             Status of service:  In process, will continue to follow Medicare Important Message given?   (If response is "NO", the following Medicare IM given date fields will be blank) Date Medicare IM given:   Medicare IM given by:   Date Additional Medicare IM given:   Additional Medicare IM given by:    Discharge Disposition:    Per UR Regulation:  Reviewed for med. necessity/level of care/duration of stay  If discussed at [place] of Stay Meetings, dates discussed:    Comments:

## 2014-11-14 NOTE — Progress Notes (Signed)
Occupational Therapy Evaluation Patient Details Name: Devin Callahan MRN: [id number] DOB: 1946/04/01 Today's Date: 11/14/2014    History of Present Illness Patient is a 69 yo male s/p Left frontoparietal craniotomy for evacuation of subacute subdural hematoma and resection of small dural based lesion.   Clinical Impression   PTA, pt independent with ADL and mobility. Pt currently requires Min A with ADL and mobility. Pt with apparent cognitive deficits as indicated by scoring a 16/30 on the [facility]; demonstrating difficulty with visuospatial.executive functions; abstraction; delayed recall, verbal fluency and attention. Recommend a Speech consult to further assess cognition/linguistics. Will follow acutely and recommend pt D/C home with 24/7 S initially and HHOT. Pt may be appropriate for [facility] Wednesday if medically stable.    Follow Up Recommendations  Home health OT;Supervision/Assistance - 24 hour  Refrain from driving   Equipment Recommendations  Tub/shower seat    Recommendations for Other Services       Precautions / Restrictions Precautions Precautions: Fall      Mobility Bed Mobility Overal bed mobility: Modified Independent Bed Mobility: Supine to Sit;Sit to Supine     Supine to sit: Supervision Sit to supine: Supervision   General bed mobility comments: no physical assist required, increased time to perform  Transfers Overall transfer level: Needs assistance Equipment used: 1 person hand held assist Transfers: Sit to/from Stand;Stand Pivot Transfers Sit to Stand: Min assist Stand pivot transfers: Min assist       General transfer comment: steady assist    Balance Overall balance assessment: Needs assistance Sitting-balance support: Feet supported Sitting balance-Leahy Scale: Good     Standing balance support: During functional activity Standing balance-Leahy Scale: Poor                              ADL Overall ADL's : Needs  assistance/impaired     Grooming: Wash/dry hands;Wash/dry face;Supervision/safety;Standing Grooming Details (indicate cue type and reason): Pt washed hands then began to leave sink with wet hands. VC to dry hands. Upper Body Bathing: Set up   Lower Body Bathing: Min guard;Sit to/from stand   Upper Body Dressing : Min guard   Lower Body Dressing: Min guard   Toilet Transfer: Minimal assistance;Ambulation   Toileting- Clothing Manipulation and Hygiene: Minimal assistance;Sit to/from stand Toileting - Clothing Manipulation Details (indicate cue type and reason): steady assist     Functional mobility during ADLs: Minimal assistance General ADL Comments: Steady assist proveded. VC to dry hands after washing. Slow processing.     Vision Additional Comments: will further assess   Perception Perception Comments: will further assess. Appears intact   Praxis Praxis Praxis-Other Comments: ? perseveration at times when writing numbers    Pertinent Vitals/Pain Pain Assessment: No/denies pain     Hand Dominance Left   Extremity/Trunk Assessment Upper Extremity Assessment Upper Extremity Assessment: Overall WFL for tasks assessed   Lower Extremity Assessment Lower Extremity Assessment: Overall WFL for tasks assessed   Cervical / Trunk Assessment Cervical / Trunk Assessment: Normal   Communication Communication Communication: Expressive difficulties (word finding difficulties at times)   Cognition Arousal/Alertness: Awake/alert Behavior During Therapy: Flat affect Overall Cognitive Status: Impaired/Different from baseline Area of Impairment: Attention;Memory;Safety/judgement;Awareness;Problem solving   Current Attention Level: Selective Memory: Decreased short-term memory   Safety/Judgement: Decreased awareness of safety;Decreased awareness of deficits Awareness: Emergent Problem Solving: Slow processing;Difficulty sequencing (required cues to dry hands ) General  Comments: Assessed with MOCA and pt demonstrated deficits with  apparent visuospatial/executive functions; delayed recall ( 0/5) ; attention; and abstraction. Pt scored 16/30 (below 26 is abnormal).    General Comments       Exercises       Shoulder Instructions      Home Living Family/patient expects to be discharged to:: Private residence Living Arrangements: Spouse/significant other Available Help at Discharge: Family;Available 24 hours/day Type of Home: House Home Access: Stairs to enter [employer] of Steps: 2 Entrance Stairs-Rails: Can reach both Home Layout: Bed/bath upstairs;Two level Alternate Level Stairs-Number of Steps: 6 Alternate Level Stairs-Rails: Right Bathroom Shower/Tub: Occupational psychologist: Standard Bathroom Accessibility: Yes How Accessible: Accessible via walker Home Equipment: Walker - 2 wheels   Additional Comments: split level home      Prior Functioning/Environment Level of Independence: Independent        Comments: retired from [employer]    OT Diagnosis: Generalized weakness;Cognitive deficits   OT Problem List: Decreased activity tolerance;Impaired balance (sitting and/or standing);Decreased cognition;Decreased safety awareness;Obesity   OT Treatment/Interventions: Self-care/ADL training;DME and/or AE instruction;Therapeutic activities;Patient/family education;Cognitive remediation/compensation;Balance training    OT Goals(Current goals can be found in the care plan section) Acute Rehab OT Goals Patient Stated Goal: to get better OT Goal Formulation: With patient/family Time For Goal Achievement: 11/28/14 Potential to Achieve Goals: Good  OT Frequency: Min 2X/week   Barriers to D/C:            Co-evaluation              End of Session Equipment Utilized During Treatment: Gait belt Nurse Communication: Mobility status  Activity Tolerance: Patient tolerated treatment well Patient left: in bed;with  call bell/phone within reach;with bed alarm set;with family/visitor present   Time: 4967-5916 OT Time Calculation (min): 32 min Charges:  OT General Charges $OT Visit: 1 Procedure OT Evaluation $Initial OT Evaluation Tier I: 1 Procedure OT Treatments $Therapeutic Activity: 8-22 mins G-Codes:    Devin Callahan,Devin Callahan 2014-11-29, 4:44 PM   [facility], OTR/L  [phone] November 29, 2014

## 2014-11-14 NOTE — Evaluation (Signed)
Physical Therapy Evaluation Patient Details Name: Devin Callahan MRN: [id number] DOB: [birth year] Today's Date: 11/14/2014   History of Present Illness  Patient is a 69 yo male s/p Left frontoparietal craniotomy for evacuation of subacute subdural hematoma and resection of small dural based lesion.  Clinical Impression  Patient demonstrates deficits in functional mobility as indicated below. Will need continued skilled PT to address deficits and maximize function. Will see as indicated and progress as tolerated. OF NOTE: patient demonstrates some instability with ambulation in addition to cognitive deficits and inability to multitask during mobility. Recommend cognitive evaluation from SLP.     Follow Up Recommendations Home health PT;Supervision/Assistance - 24 hour    Equipment Recommendations  None recommended by PT    Recommendations for Other Services       Precautions / Restrictions Precautions Precautions: Fall      Mobility  Bed Mobility Overal bed mobility: Needs Assistance Bed Mobility: Supine to Sit;Sit to Supine     Supine to sit: Supervision Sit to supine: Supervision   General bed mobility comments: no physical assist required, increased time to perform  Transfers Overall transfer level: Needs assistance Equipment used: None Transfers: Sit to/from Stand Sit to Stand: Min assist         General transfer comment: min assist to power to standing, assist for stability with noted posterior LOB intially  Ambulation/Gait Ambulation/Gait assistance: Min assist Ambulation Distance (Feet): 110 Feet Assistive device: None Gait Pattern/deviations: Step-through pattern;Decreased stride length;Drifts right/left Gait velocity: decreased Gait velocity interpretation: Below normal speed for age/gender General Gait Details: VCs for ambulation and safety, multimodal cues for pacing and increased gait speed.   Stairs            Wheelchair Mobility    Modified  Rankin (Stroke Patients Only)       Balance Overall balance assessment: Needs assistance Sitting-balance support: Feet supported Sitting balance-Leahy Scale: Good     Standing balance support: No upper extremity supported;During functional activity Standing balance-Leahy Scale: Poor                               Pertinent Vitals/Pain Pain Assessment: No/denies pain    Home Living Family/patient expects to be discharged to:: Private residence Living Arrangements: Spouse/significant other Available Help at Discharge: Family Type of Home: House Home Access: Stairs to enter Entrance Stairs-Rails: Can reach both Entrance Stairs-Number of Steps: 2 Home Layout: Bed/bath upstairs;Two level        Prior Function Level of Independence: Independent               Hand Dominance   Dominant Hand: Left    Extremity/Trunk Assessment   Upper Extremity Assessment: Defer to OT evaluation           Lower Extremity Assessment: Overall WFL for tasks assessed (strength upon assessment equal and symetrical)         Communication      Cognition Arousal/Alertness: Awake/alert Behavior During Therapy: Flat affect Overall Cognitive Status: Impaired/Different from baseline Area of Impairment: Attention;Memory;Awareness;Problem solving   Current Attention Level: Focused Memory: Decreased short-term memory     Awareness: Intellectual Problem Solving: Slow processing;Requires verbal cues General Comments: Patient with increased time to respond to questions, difficulty with multitasks, attempted to have patient sit EOB and spell name backwards, both trials of first and last name incorrect. Had patient ambulated and count backwards required increased time and again unable to perform with  complete accuracy. Per son present at bedside, pt thinking is off.    General Comments      Exercises        Assessment/Plan    PT Assessment Patient needs continued PT  services  PT Diagnosis Difficulty walking;Abnormality of gait;Altered mental status   PT Problem List Decreased activity tolerance;Decreased balance;Decreased mobility;Decreased coordination;Decreased cognition  PT Treatment Interventions DME instruction;Gait training;Stair training;Functional mobility training;Therapeutic activities;Therapeutic exercise;Balance training;Patient/family education   PT Goals (Current goals can be found in the Care Plan section) Acute Rehab PT Goals Patient Stated Goal: to get some rest PT Goal Formulation: With patient Time For Goal Achievement: 11/28/14 Potential to Achieve Goals: Good    Frequency Min 3X/week   Barriers to discharge        Co-evaluation               End of Session Equipment Utilized During Treatment: Gait belt Activity Tolerance: Patient tolerated treatment well Patient left: in bed;with call bell/phone within reach;with bed alarm set;with family/visitor present Nurse Communication: Mobility status         Time: 9417-4081 PT Time Calculation (min) (ACUTE ONLY): 21 min   Charges:   PT Evaluation $Initial PT Evaluation Tier I: 1 Procedure     PT G CodesDuncan Dull 11-17-14, 3:47 PM Alben Deeds, [place] DPT  [phone]

## 2014-11-15 ENCOUNTER — Encounter (HOSPITAL_COMMUNITY): Payer: Self-pay | Admitting: Neurological Surgery

## 2014-11-15 MED ORDER — PANTOPRAZOLE SODIUM 40 MG PO TBEC
40.0000 mg | DELAYED_RELEASE_TABLET | Freq: Every day | ORAL | Status: DC
  Administered 2014-11-15 – 2014-11-16 (×2): 40 mg via ORAL
  Filled 2014-11-15 (×2): qty 1

## 2014-11-15 MED ORDER — BISACODYL 10 MG RE SUPP
10.0000 mg | Freq: Every day | RECTAL | Status: DC | PRN
  Administered 2014-11-15: 10 mg via RECTAL
  Filled 2014-11-15: qty 1

## 2014-11-15 MED ORDER — LEVETIRACETAM 500 MG PO TABS
500.0000 mg | ORAL_TABLET | Freq: Two times a day (BID) | ORAL | Status: DC
  Administered 2014-11-15 – 2014-11-17 (×5): 500 mg via ORAL
  Filled 2014-11-15 (×5): qty 1

## 2014-11-15 NOTE — Evaluation (Signed)
Speech Language Pathology Evaluation Patient Details Name: SHAYON TROMPETER MRN: [id number] DOB: [birth year] Today's Date: 11/15/2014 Time: 9485-4627 SLP Time Calculation (min) (ACUTE ONLY): 39 min  Problem List:  Patient Active Problem List   Diagnosis Date Noted  . S/P craniotomy 11/12/2014  . Subdural hematoma 11/11/2014  . 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 Knee stiffness 01/08/2011  . 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 JOINT EFFUSION, KNEE 02/07/2010  . HIGH BLOOD PRESSURE 05/04/2008  . OSTEOARTHRITIS, LOWER LEG 05/21/2007   Past Medical History:  Past Medical History  Diagnosis Date  . High blood pressure     Cholesterol   Past Surgical History:  Past Surgical History  Procedure Laterality Date  . Foot surgery    . Back surgery  1999  . Colon surgery  2009  . Knee surgery  3.20.2012    knee replacement LEFT    . Knee surgery      RIGHT knee replacement  . Craniotomy Left 11/12/2014    Procedure: LEFT SIDED CRANIOTOMY FOR EVACUATION OF SUBDURAL HEMATOMA;  Surgeon: Eustace Moore, MD;  Location: [place] [facility];  Service: Neurosurgery;  Laterality: Left;   HPI:  Patient is a 69 yo male s/p Left frontoparietal craniotomy for evacuation of subacute subdural hematoma and resection of small dural based lesion.   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 Comprehension of basic information appears grossly intact, although pt does have cognitive deficits in the areas of sustained attention, basic problem solving, working memory, and self-monitoring/-correcting that impact his abiltiy to participate in functional tasks. Pt required Mod-Max cues from SLP for clock drawing (without specific time) due to the aforementioned impairments. Pt will benfit from [facility] SLP to maximize functional communication and cognition to increase level of independence. Discussed need for 24/7 supervision upon return  home with pt, wife, and sister.    SLP Assessment  Patient needs continued Speech Lanaguage Pathology Services    Follow Up Recommendations  Home health SLP;24 hour supervision/assistance    Frequency and Duration min 2x/week  2 weeks   Pertinent Vitals/Pain Pain Assessment: No/denies pain   SLP Goals  Patient/Family Stated Goal: none stated Potential to Achieve Goals (ACUTE ONLY): Good  SLP Evaluation Prior Functioning  Cognitive/Linguistic Baseline: Within functional limits Type of Home: House  Lives With: Spouse Available Help at Discharge: Family;Available 24 hours/day   Cognition  Overall Cognitive Status: Impaired/Different from baseline Arousal/Alertness: Awake/alert Orientation Level: Oriented X4 Attention: Sustained Sustained Attention: Impaired Sustained Attention Impairment: Functional basic Memory: Impaired Memory Impairment: Retrieval deficit;Decreased recall of new information Awareness: Impaired Awareness Impairment: Intellectual impairment;Emergent impairment;Anticipatory impairment Problem Solving: Impaired Problem Solving Impairment: Functional basic Executive Function: Self Monitoring;Organizing;Self Correcting;Sequencing Sequencing: Impaired Sequencing Impairment: Functional basic Organizing: Impaired Organizing Impairment: Functional basic Self Monitoring: Impaired Self Monitoring Impairment: Functional basic Self Correcting: Impaired Self Correcting Impairment: Functional basic Safety/Judgment: Impaired    Comprehension  Auditory Comprehension Overall Auditory Comprehension: Appears within functional limits for tasks assessed Visual Recognition/Discrimination Discrimination: Within Function Limits Reading Comprehension Reading Status: Not tested    Expression Expression Primary Mode of Expression: Verbal Verbal Expression Overall Verbal Expression: Impaired Initiation: No impairment Naming: Impairment Confrontation: Within functional  limits Verbal Errors: Other (comment) (anomia in conversation) Pragmatics: Impairment Impairments: Abnormal affect;Monotone Non-Verbal Means of Communication: Not applicable Written Expression Written Expression: Within Functional Limits (with basic biographical information)   Oral / Motor Motor Speech Overall Motor Speech: Appears within functional limits  for tasks assessed    Germain Osgood, M.A. CCC-SLP [phone]  Germain Osgood 11/15/2014, 4:01 PM

## 2014-11-15 NOTE — Progress Notes (Signed)
CARE MANAGEMENT NOTE 11/15/2014  Patient:  RAJAT, STAVER   Account Number:  [id number]  Date Initiated:  11/14/2014  Documentation initiated by:  Lorne Skeens  Subjective/Objective Assessment:   Patient was admitted with left subacute subdural hematoma. Underwent left craniotomy for evacuation. Lives at home with spouse.     Action/Plan:   Will follow for discharge needs pending PT/OT evals and physician orders.   Anticipated DC Date:  11/16/2014   Anticipated DC Plan:  [place]  CM consult      Choice offered to / List presented to:  C-3 Spouse        HH arranged  HH-2 PT  HH-3 OT      [place]   Status of service:  Completed, signed off Medicare Important Message given?  YES (If response is "NO", the following Medicare IM given date fields will be blank) Date Medicare IM given:  11/15/2014 Medicare IM given by:  Lorne Skeens Date Additional Medicare IM given:   Additional Medicare IM given by:    Discharge Disposition:    Per UR Regulation:  Reviewed for med. necessity/level of care/duration of stay  If discussed at [place] of Stay Meetings, dates discussed:    Comments:  11/15/14 Jay, MSN, CM- Met with patient and wife to discuss home health care. Patient and wife are agreeable and have chosen [place], which they have used in the past. Stanton Kidney with [facility] was notified and has accepted the referral for potential discharge tomorrow.  Medicare IM letter provided.

## 2014-11-15 NOTE — Progress Notes (Signed)
Doing well, no headache, neuro non-focal, incision looks good  continue PT/OT  Likely home soon

## 2014-11-15 NOTE — Progress Notes (Signed)
Physical Therapy Treatment Patient Details Name: Devin Callahan MRN: [id number] DOB: [birth year] Today's Date: 11/15/2014    History of Present Illness Patient is a 69 yo male s/p Left frontoparietal craniotomy for evacuation of subacute subdural hematoma and resection of small dural based lesion.    PT Comments    Patient seen for mobility progression. Improvements noted with basic mobility (transfers, ambulation). Utilized RW this session.  Higher level mobility still remains impaired. Patient with instability and gait changes when attempting to perform cognitive tasks in conjunction with mobility. Spoke with patient and wife at length regarding improvements as well as concerns. Discussed need for supervision. Wife verbalizes and appears to have good understanding of patient needs.  Continue to recommend HHPT upon acute discharge as well as supervision for cognitive tasks.   Follow Up Recommendations  Home health PT;Supervision/Assistance - 24 hour     Equipment Recommendations  None recommended by PT    Recommendations for Other Services       Precautions / Restrictions Precautions Precautions: Fall    Mobility  Bed Mobility Overal bed mobility: Modified Independent                Transfers Overall transfer level: Needs assistance Equipment used: Rolling walker (2 wheeled);None Transfers: Sit to/from Stand Sit to Stand: Supervision         General transfer comment: patient able to perform x3 without physical assist, modest instability noted but no assist required for stability this session  Ambulation/Gait Ambulation/Gait assistance: Min guard Ambulation Distance (Feet): 180 Feet Assistive device: Rolling walker (2 wheeled) (60 ft without device) Gait Pattern/deviations: Step-through pattern;Decreased stride length;Drifts right/left Gait velocity: decreased Gait velocity interpretation: Below normal speed for age/gender General Gait Details: continued cues for  increased cadence, overall improvements noted in stability although when cognitive tasks involved with mobility, patient has difficulty attending to task as well as ambulation; instability and decreased speed until task completed.   Stairs            Wheelchair Mobility    Modified Rankin (Stroke Patients Only)       Balance   Sitting-balance support: Feet supported Sitting balance-Leahy Scale: Good     Standing balance support: During functional activity Standing balance-Leahy Scale: Poor                      Cognition Arousal/Alertness: Awake/alert Behavior During Therapy: Flat affect Overall Cognitive Status: Impaired/Different from baseline     Current Attention Level: Selective       Awareness: Emergent Problem Solving: Slow processing;Requires verbal cues      Exercises      General Comments        Pertinent Vitals/Pain Pain Assessment: No/denies pain    Home Living                      Prior Function            PT Goals (current goals can now be found in the care plan section) Acute Rehab PT Goals Patient Stated Goal: to get back home PT Goal Formulation: With patient Time For Goal Achievement: 11/28/14 Potential to Achieve Goals: Good Progress towards PT goals: Progressing toward goals    Frequency  Min 3X/week    PT Plan Current plan remains appropriate    Co-evaluation             End of Session Equipment Utilized During Treatment: Gait belt Activity Tolerance: Patient tolerated  treatment well Patient left: in bed;with call bell/phone within reach;with bed alarm set;with family/visitor present     Time: 4540-9811 PT Time Calculation (min) (ACUTE ONLY): 26 min  Charges:  $Gait Training: 8-22 mins $Self Care/Home Management: 8-22                    G CodesDuncan Dull 12/12/2014, 2:35 PM Alben Deeds, [place] DPT  [phone]

## 2014-11-16 NOTE — Progress Notes (Signed)
Seems much better today. No drift, moves all ext, awake and alert, no aphasia (good naming, rep, fluency and understanding),oriented x4, names president, adds easily.   Pleased with progress. Maybe home soon.

## 2014-11-16 NOTE — Progress Notes (Signed)
Occupational Therapy Treatment Patient Details Name: Devin Callahan MRN: [id number] DOB: Dec 21, 1945 Today's Date: 11/16/2014    History of present illness Patient is a 69 yo male s/p Left frontoparietal craniotomy for evacuation of subacute subdural hematoma and resection of small dural based lesion.   OT comments  Pt making progress. Continues to demonstrate cognitive deficits affecting IADL tasks. Educated family on need for 24/7 S and continued HHOT. Pt ready to D/C home with 24/7 S when medically stable.   Follow Up Recommendations  Home health OT;Supervision/Assistance - 24 hour    Equipment Recommendations  Tub/shower seat    Recommendations for Other Services      Precautions / Restrictions Precautions Precautions: Fall       Mobility Bed Mobility Overal bed mobility: Modified Independent                Transfers Overall transfer level: Needs assistance   Transfers: Sit to/from Stand;Stand Pivot Transfers Sit to Stand: Supervision Stand pivot transfers: Supervision            Balance     Sitting balance-Leahy Scale: Good       Standing balance-Leahy Scale: Fair                     ADL                                         General ADL Comments: Educated family on need to 71 S initially after D/C and need to refrian from driving. Family verbalizes understanding. discussed recommendation of using a shower seat and grab bars to reduce risk of falls. Educated wife on need to let pt do ADL tasks and to only S and assist when needed. wife verbalized understanding.      Vision                 Additional Comments: Appears WFL   Perception     Praxis      Cognition   Behavior During Therapy: Flat affect Overall Cognitive Status: Impaired/Different from baseline Area of Impairment: Attention;Memory;Awareness;Problem solving   Current Attention Level: Selective Memory: Decreased short-term memory     Safety/Judgement: Decreased awareness of safety;Decreased awareness of deficits Awareness: Emergent Problem Solving: Slow processing General Comments: Making progress with cognition although continues to be impaired compared tohis baseline. Unable to subtract by 7s from 100. given 4 items to remember, Unable to recall after 3 min delay. Unable to recall with contextual cues.                              General Comments      Pertinent Vitals/ Pain       Pain Assessment: No/denies pain  Home Living                                          Prior Functioning/Environment              Frequency Min 2X/week     Progress Toward Goals  OT Goals(current goals can now be found in the care plan section)  Progress towards OT goals: Progressing toward goals  Acute Rehab OT Goals Patient Stated Goal: to get back home OT Goal Formulation:  With patient/family Time For Goal Achievement: 11/28/14 Potential to Achieve Goals: Good ADL Goals Pt Will Transfer to Toilet: with supervision;ambulating Pt Will Perform Tub/Shower Transfer: with supervision;with caregiver independent in assisting;shower seat;Tub transfer Additional ADL Goal #1: Pt will demonstrate anticipatory awareness during ADL tasks in non distracting environamentt without vc  Plan Discharge plan remains appropriate    Co-evaluation                 End of Session Equipment Utilized During Treatment: Gait belt   Activity Tolerance Patient tolerated treatment well   Patient Left in chair;with call bell/phone within reach;with family/visitor present   Nurse Communication Mobility status        Time: 9842-1031 OT Time Calculation (min): 25 min  Charges: OT General Charges $OT Visit: 1 Procedure OT Treatments $Self Care/Home Management : 23-37 mins  Jazara Swiney,[place] 11/16/2014, 5:08 PM   [facility], OTR/L  [phone] 11/16/2014

## 2014-11-17 MED ORDER — HYDROCODONE-ACETAMINOPHEN 5-325 MG PO TABS: 1.0000 | ORAL_TABLET | ORAL | Status: DC | PRN

## 2014-11-17 NOTE — Discharge Summary (Signed)
Physician Discharge Summary  Patient ID: Devin Callahan MRN: [id number] DOB/AGE: 1945/08/19 69 y.o.  Admit date: 11/11/2014 Discharge date: 11/17/2014  Admission Diagnoses: L SDH   Discharge Diagnoses: sam   Discharged Condition: good  Hospital Course: The patient was admitted on 11/11/2014 and taken to the operating room where the patient underwent L craniotomy for L SDH. The patient tolerated the procedure well and was taken to the recovery room and then to the ICU in stable condition. The hospital course was routine. There were no complications. The wound remained clean dry and intact. Pt had appropriate head soreness. No complaints of new N/T/W. Repeat head CT looked good.The patient remained afebrile with stable vital signs, and tolerated a regular diet. The patient continued to increase activities, and pain was well controlled with oral pain medications.   Consults: None  Significant Diagnostic Studies:  Results for orders placed or performed during the hospital encounter of 11/11/14  MRSA PCR Screening  Result Value Ref Range   MRSA by PCR NEGATIVE NEGATIVE  CBC  Result Value Ref Range   WBC 7.6 4.0 - 10.5 K/uL   RBC 4.70 4.22 - 5.81 MIL/uL   Hemoglobin 13.6 13.0 - 17.0 g/dL   HCT 40.0 39.0 - 52.0 %   MCV 85.1 78.0 - 100.0 fL   MCH 28.9 26.0 - 34.0 pg   MCHC 34.0 30.0 - 36.0 g/dL   RDW 13.9 11.5 - 15.5 %   Platelets 193 150 - 400 K/uL  Comprehensive metabolic panel  Result Value Ref Range   Sodium 139 135 - 145 mmol/L   Potassium 3.1 (L) 3.5 - 5.1 mmol/L   Chloride 100 96 - 112 mmol/L   CO2 31 19 - 32 mmol/L   Glucose, Bld 107 (H) 70 - 99 mg/dL   BUN 8 6 - 23 mg/dL   Creatinine, Ser 0.79 0.50 - 1.35 mg/dL   Calcium 8.9 8.4 - 10.5 mg/dL   Total Protein 7.2 6.0 - 8.3 g/dL   Albumin 3.7 3.5 - 5.2 g/dL   AST 34 0 - 37 U/L   ALT 32 0 - 53 U/L   Alkaline Phosphatase 63 39 - 117 U/L   Total Bilirubin 0.7 0.3 - 1.2 mg/dL   GFR calc non Af Amer >90 >90 mL/min   GFR calc Af  Amer >90 >90 mL/min   Anion gap 8 5 - 15  Protime-INR  Result Value Ref Range   Prothrombin Time 13.3 11.6 - 15.2 seconds   INR 1.00 0.00 - 1.49  APTT  Result Value Ref Range   aPTT 28 24 - 37 seconds  Urinalysis, Routine w reflex microscopic  Result Value Ref Range   Color, Urine YELLOW YELLOW   APPearance CLEAR CLEAR   Specific Gravity, Urine 1.014 1.005 - 1.030   pH 8.0 5.0 - 8.0   Glucose, UA NEGATIVE NEGATIVE mg/dL   Hgb urine dipstick NEGATIVE NEGATIVE   Bilirubin Urine NEGATIVE NEGATIVE   Ketones, ur NEGATIVE NEGATIVE mg/dL   Protein, ur NEGATIVE NEGATIVE mg/dL   Urobilinogen, UA 0.2 0.0 - 1.0 mg/dL   Nitrite NEGATIVE NEGATIVE   Leukocytes, UA NEGATIVE NEGATIVE  CBC  Result Value Ref Range   WBC 8.6 4.0 - 10.5 K/uL   RBC 4.79 4.22 - 5.81 MIL/uL   Hemoglobin 13.7 13.0 - 17.0 g/dL   HCT 41.7 39.0 - 52.0 %   MCV 87.1 78.0 - 100.0 fL   MCH 28.6 26.0 - 34.0 pg   MCHC  32.9 30.0 - 36.0 g/dL   RDW 14.1 11.5 - 15.5 %   Platelets 183 150 - 400 K/uL  Protime-INR  Result Value Ref Range   Prothrombin Time 14.1 11.6 - 15.2 seconds   INR 1.08 0.00 - 1.49  Comprehensive metabolic panel  Result Value Ref Range   Sodium 140 135 - 145 mmol/L   Potassium 3.1 (L) 3.5 - 5.1 mmol/L   Chloride 100 96 - 112 mmol/L   CO2 35 (H) 19 - 32 mmol/L   Glucose, Bld 97 70 - 99 mg/dL   BUN 7 6 - 23 mg/dL   Creatinine, Ser 0.75 0.50 - 1.35 mg/dL   Calcium 8.9 8.4 - 10.5 mg/dL   Total Protein 7.2 6.0 - 8.3 g/dL   Albumin 3.8 3.5 - 5.2 g/dL   AST 34 0 - 37 U/L   ALT 31 0 - 53 U/L   Alkaline Phosphatase 63 39 - 117 U/L   Total Bilirubin 0.7 0.3 - 1.2 mg/dL   GFR calc non Af Amer >90 >90 mL/min   GFR calc Af Amer >90 >90 mL/min   Anion gap 5 5 - 15  I-stat troponin, ED (not at [facility])  Result Value Ref Range   Troponin i, poc 0.00 0.00 - 0.08 ng/mL   Comment 3          I-Stat CG4 Lactic Acid, ED  Result Value Ref Range   Lactic Acid, Venous 0.80 0.5 - 2.0 mmol/L  CG4 I-STAT (Lactic acid)   Result Value Ref Range   Lactic Acid, Venous 0.93 0.5 - 2.0 mmol/L    Dg Chest 1 View  11/11/2014   CLINICAL DATA:  Headache and weakness this morning.  EXAM: CHEST  1 VIEW  COMPARISON:  09/13/2013  FINDINGS: Lungs are adequately inflated without consolidation or effusion. Cardiomediastinal silhouette is within normal. There is mild calcified plaque over the aortic arch. Mild degenerative change of the spine.  IMPRESSION: No active disease.   Electronically Signed   By: Marin Olp M.D.   On: 11/11/2014 11:46   Ct Head Wo Contrast  11/13/2014   CLINICAL DATA:  69 year old male with subdural hematoma post drainage. Subsequent encounter.  EXAM: CT HEAD WITHOUT CONTRAST  TECHNIQUE: Contiguous axial images were obtained from the base of the skull through the vertex without intravenous contrast.  COMPARISON:  11/11/2014.  FINDINGS: This exam was interpreted during a PACS downtime with limited availability of comparison cases.  Post left craniotomy for drainage of left-sided subdural hematoma with drain in place. Decrease in size of left-sided subdural collection with residual left-sided subdural collection having maximal thickness of 10.8 mm versus prior 14.7 mm. Small amount of pneumocephalus.  Decrease in mass effect upon the left lateral ventricle and decreased midline shift. Current right midline shift of 8.1 mm versus prior 14.2 mm.  No CT evidence of large acute infarct.  No intracranial mass lesion noted on this unenhanced exam.  Polypoid opacification inferior aspect left maxillary sinus. Minimal mucosal thickening left frontal sinus.  Remote right zygomatic arch fracture.  Exophthalmos.  IMPRESSION: Post left craniotomy for drainage of left-sided subdural hematoma with drain in place. Decrease in size of left-sided subdural collection with residual left-sided subdural collection having maximal thickness of 10.8 mm versus prior 14.7 mm. Small amount of pneumocephalus.  Decrease in mass effect upon the  left lateral ventricle and decreased midline shift. Current right midline shift of 8.1 mm versus prior 14.2 mm.   Electronically Signed  By: Genia Del M.D.   On: 11/13/2014 08:45   Ct Head Wo Contrast  11/11/2014   CLINICAL DATA:  Persistent headache for 2 days. Seven of out of 10 pain.  EXAM: CT HEAD WITHOUT CONTRAST  TECHNIQUE: Contiguous axial images were obtained from the base of the skull through the vertex without intravenous contrast.  COMPARISON:  None.  FINDINGS: There is a large high-density extra-axial fluid collection along the entirety of the left cerebral hemisphere measuring 12 mm in depth. There is a fluid fluid level anteriorly within this extra-axial fluid collection consistent with hematocrit effect. There is mass effect from this fluid collection with 15 mm of rightward midline shift. There is some entrapment of the right ventricle with prominence of the temporal horn of the right ventricle. The quadrigeminal plate cistern is effaced on the right. The fourth ventricle is patent. The left lateral ventricle is mildly compressed.  There is no acute intraparenchymal hemorrhage. Intraventricular hemorrhage. No evidence of acute cortical infarction. No skull fracture. Paranasal sinuses clear per  IMPRESSION: 1. Large acute left subdural hematoma extending along the entirety of the left cerebral convexity. 2. Significant mass effect with 15 mm rightward shift, entrapment of the right ventricle, and effacement of the right quadrigeminal plate cistern. 3. Fourth ventricle is patent. Critical Value/emergent results were called by telephone at the time of interpretation on 11/11/2014 at 12:19 pm to Dr. Delos Haring , who verbally acknowledged these results.   Electronically Signed   By: Suzy Bouchard M.D.   On: 11/11/2014 12:22    Antibiotics:  Anti-infectives    Start     Dose/Rate Route Frequency Ordered Stop   11/12/14 1700  ceFAZolin (ANCEF) IVPB 2 g/50 mL premix     2 g 100 mL/hr over  30 Minutes Intravenous Every 8 hours 11/12/14 1233 11/13/14 0216   11/12/14 0935  bacitracin 50,000 Units in sodium chloride irrigation 0.9 % 500 mL irrigation  Status:  Discontinued       As needed 11/12/14 0936 11/12/14 1117      Discharge Exam: Blood pressure 138/71, pulse 94, temperature 98.2 F (36.8 C), temperature source Oral, resp. rate 18, height 6\' 3"  (1.905 m), weight 301 lb (136.533 kg), SpO2 97 %. Neurologic: Grossly normal incisionCDI  Discharge Medications:     Medication List    STOP taking these medications        gabapentin 100 MG capsule  Commonly known as:  NEURONTIN      TAKE these medications        albuterol 108 (90 BASE) MCG/ACT inhaler  Commonly known as:  PROVENTIL HFA;VENTOLIN HFA  Inhale 1-2 puffs into the lungs every 6 (six) hours as needed for wheezing or shortness of breath.     ALLEGRA ALLERGY 180 MG tablet  Generic drug:  fexofenadine  Take 180 mg by mouth.     allopurinol 300 MG tablet  Commonly known as:  ZYLOPRIM  Take 300 mg by mouth daily as needed (gout).     amLODipine 10 MG tablet  Commonly known as:  NORVASC  Take 10 mg by mouth daily.     CRESTOR 20 MG tablet  Generic drug:  rosuvastatin  Take 20 mg by mouth every evening.     hydrochlorothiazide 25 MG tablet  Commonly known as:  HYDRODIURIL  Take 25 mg by mouth daily.     HYDROcodone-acetaminophen 5-325 MG per tablet  Commonly known as:  NORCO/VICODIN  Take 1 tablet by mouth every 4 (  four) hours as needed for moderate pain.     KLOR-CON M20 20 MEQ tablet  Generic drug:  potassium chloride SA  Take 20 mEq by mouth daily as needed (potassium).     omeprazole 20 MG capsule  Commonly known as:  PRILOSEC  Take 20 mg by mouth daily.        Disposition[facility]   Final Dx: craniotomy for SDH      Discharge Instructions    Call MD for:  difficulty breathing, headache or visual disturbances    Complete by:  As directed      Call MD for:  persistant nausea  and vomiting    Complete by:  As directed      Call MD for:  redness, tenderness, or signs of infection (pain, swelling, redness, odor or green/yellow discharge around incision site)    Complete by:  As directed      Call MD for:  severe uncontrolled pain    Complete by:  As directed      Call MD for:  temperature >100.4    Complete by:  As directed      Diet - low sodium heart healthy    Complete by:  As directed      Discharge instructions    Complete by:  As directed   May shower, no heavy lifting, no driving     Increase activity slowly    Complete by:  As directed            Follow-up Information    Follow up with Faithanne Verret S, MD. Schedule an appointment as soon as possible for a visit in 2 weeks.   Specialty:  Neurosurgery   Contact information:   [address] [place] [address] [phone]        Signed: Eustace Moore 11/17/2014, 2:21 PM

## 2014-11-17 NOTE — Progress Notes (Signed)
Physical Therapy Treatment Patient Details Name: Devin Callahan MRN: [id number] DOB: [birth year] Today's Date: 11/17/2014    History of Present Illness Patient is a 69 yo male s/p Left frontoparietal craniotomy for evacuation of subacute subdural hematoma and resection of small dural based lesion.    PT Comments    Patient mobilizing well this session. Ambulated increased distance. Patient also able to perform stair negotiation without physical assist. Improvements noted with cognition but continues to demonstrate some word finding difficulties during conversation.    Follow Up Recommendations  Home health PT;Supervision/Assistance - 24 hour     Equipment Recommendations  None recommended by PT    Recommendations for Other Services       Precautions / Restrictions Restrictions Weight Bearing Restrictions: No    Mobility  Bed Mobility Overal bed mobility: Modified Independent                Transfers Overall transfer level: Modified independent Equipment used: Rolling walker (2 wheeled) Transfers: Sit to/from Stand Sit to Stand: Modified independent (Device/Increase time)         General transfer comment: no cues or physical assist to sit down. Prior to sitting patient was able to remake bed sheet and reposition pad/towel prior to getting back in to the bed  Ambulation/Gait Ambulation/Gait assistance: Modified independent (Device/Increase time) Ambulation Distance (Feet): 420 Feet Assistive device: Rolling walker (2 wheeled) Gait Pattern/deviations: Step-through pattern;Decreased stride length     General Gait Details: improved stability, able to perform increased distance today without cues or assist, using RW.   Stairs Stairs: Yes Stairs assistance: Min guard Stair Management: One rail Right;Step to pattern;Forwards Number of Stairs: 12 General stair comments: no physical assist needed, patient varying between step to and alternating secondary to knee  discomfort  Wheelchair Mobility    Modified Rankin (Stroke Patients Only)       Balance     Sitting balance-Leahy Scale: Good       Standing balance-Leahy Scale: Fair                      Cognition Arousal/Alertness: Awake/alert Behavior During Therapy: Flat affect Overall Cognitive Status: Impaired/Different from baseline Area of Impairment: Attention;Memory;Awareness;Problem solving   Current Attention Level: Selective Memory: Decreased short-term memory   Safety/Judgement: Decreased awareness of safety;Decreased awareness of deficits Awareness: Emergent Problem Solving: Slow processing General Comments: Making steady progress with cogntiion but continues to demonstrate word finding difficulties at times    Exercises      General Comments        Pertinent Vitals/Pain Pain Assessment: Faces Faces Pain Scale: Hurts a little bit Pain Location: head Pain Descriptors / Indicators: Headache Pain Intervention(s): Monitored during session;Premedicated before session    Home Living                      Prior Function            PT Goals (current goals can now be found in the care plan section) Acute Rehab PT Goals Patient Stated Goal: to get back home PT Goal Formulation: With patient Time For Goal Achievement: 11/28/14 Potential to Achieve Goals: Good Progress towards PT goals: Progressing toward goals    Frequency  Min 3X/week    PT Plan Current plan remains appropriate    Co-evaluation             End of Session Equipment Utilized During Treatment: Gait belt Activity Tolerance: Patient tolerated treatment  well Patient left: in bed;with call bell/phone within reach;with bed alarm set;with family/visitor present     Time: 9798-9211 PT Time Calculation (min) (ACUTE ONLY): 22 min  Charges:  $Gait Training: 8-22 mins                    G CodesDuncan Dull Dec 12, 2014, 3:10 PM Alben Deeds, [place] DPT   [phone]

## 2014-11-17 NOTE — Progress Notes (Signed)
Patient is discharged from room 4N04 at this time. Alert and in stable condition. IV site d/c'd/ Instructions read to patient with understanding verbalized. Left unit via wheelchair with all belongings and wife at side.

## 2014-11-22 ENCOUNTER — Other Ambulatory Visit: Payer: Self-pay | Admitting: Neurological Surgery

## 2014-11-22 DIAGNOSIS — S065X9A Traumatic subdural hemorrhage with loss of consciousness of unspecified duration, initial encounter: Secondary | ICD-10-CM

## 2014-11-22 DIAGNOSIS — S065XAA Traumatic subdural hemorrhage with loss of consciousness status unknown, initial encounter: Secondary | ICD-10-CM

## 2014-11-24 ENCOUNTER — Ambulatory Visit
Admission: RE | Admit: 2014-11-24 | Discharge: 2014-11-24 | Disposition: A | Discharge status: Home / Self Care | Payer: Medicare Other | Source: Ambulatory Visit | Attending: Neurological Surgery | Admitting: Neurological Surgery

## 2014-11-24 DIAGNOSIS — S065X9A Traumatic subdural hemorrhage with loss of consciousness of unspecified duration, initial encounter: Secondary | ICD-10-CM

## 2014-11-24 DIAGNOSIS — S065XAA Traumatic subdural hemorrhage with loss of consciousness status unknown, initial encounter: Secondary | ICD-10-CM

## 2014-11-25 ENCOUNTER — Other Ambulatory Visit: Payer: Medicare (Managed Care)

## 2014-11-30 ENCOUNTER — Other Ambulatory Visit: Payer: Self-pay | Admitting: Neurological Surgery

## 2014-11-30 DIAGNOSIS — S065X9A Traumatic subdural hemorrhage with loss of consciousness of unspecified duration, initial encounter: Secondary | ICD-10-CM

## 2014-11-30 DIAGNOSIS — S065XAA Traumatic subdural hemorrhage with loss of consciousness status unknown, initial encounter: Secondary | ICD-10-CM

## 2014-12-27 ENCOUNTER — Ambulatory Visit
Admission: RE | Admit: 2014-12-27 | Discharge: 2014-12-27 | Disposition: A | Discharge status: Home / Self Care | Payer: Medicare Other | Source: Ambulatory Visit | Attending: Neurological Surgery | Admitting: Neurological Surgery

## 2014-12-27 DIAGNOSIS — S065X9A Traumatic subdural hemorrhage with loss of consciousness of unspecified duration, initial encounter: Secondary | ICD-10-CM

## 2014-12-27 DIAGNOSIS — S065XAA Traumatic subdural hemorrhage with loss of consciousness status unknown, initial encounter: Secondary | ICD-10-CM

## 2015-10-24 ENCOUNTER — Ambulatory Visit (INDEPENDENT_AMBULATORY_CARE_PROVIDER_SITE_OTHER): Payer: Medicare Other | Admitting: Urology

## 2015-10-24 DIAGNOSIS — R310 Gross hematuria: Secondary | ICD-10-CM | POA: Diagnosis not present

## 2016-01-30 ENCOUNTER — Ambulatory Visit (INDEPENDENT_AMBULATORY_CARE_PROVIDER_SITE_OTHER): Payer: Medicare Other | Admitting: Urology

## 2016-01-30 ENCOUNTER — Other Ambulatory Visit (HOSPITAL_COMMUNITY)
Admission: RE | Admit: 2016-01-30 | Discharge: 2016-01-30 | Disposition: A | Discharge status: Home / Self Care | Payer: Medicare Other | Source: Other Acute Inpatient Hospital | Attending: Urology | Admitting: Urology

## 2016-01-30 DIAGNOSIS — R3915 Urgency of urination: Secondary | ICD-10-CM

## 2016-01-30 DIAGNOSIS — R3129 Other microscopic hematuria: Secondary | ICD-10-CM | POA: Insufficient documentation

## 2016-01-30 DIAGNOSIS — R310 Gross hematuria: Secondary | ICD-10-CM

## 2016-01-30 LAB — URINALYSIS, ROUTINE W REFLEX MICROSCOPIC
Bilirubin Urine: NEGATIVE
Glucose, UA: NEGATIVE mg/dL
Hgb urine dipstick: NEGATIVE
KETONES UR: NEGATIVE mg/dL
Leukocytes, UA: NEGATIVE
NITRITE: NEGATIVE
PROTEIN: NEGATIVE mg/dL
Specific Gravity, Urine: 1.025 (ref 1.005–1.030)
pH: 6 (ref 5.0–8.0)

## 2016-12-06 IMAGING — CT CT HEAD W/O CM
2 series · 16 of 30 positions shown, 20 images · non-contrast
Comparison: CT 11/13/2014

CLINICAL DATA: Followup subdural hematoma

EXAM:
CT HEAD WITHOUT CONTRAST
TECHNIQUE: Contiguous axial images were obtained from the base of the skull
through the vertex without intravenous contrast.

[Series 3: head bone · axial · 0.49mm/px · z∈[+11,+57]mm · 3 of 32 slices shown]
[im 3/32  bone]
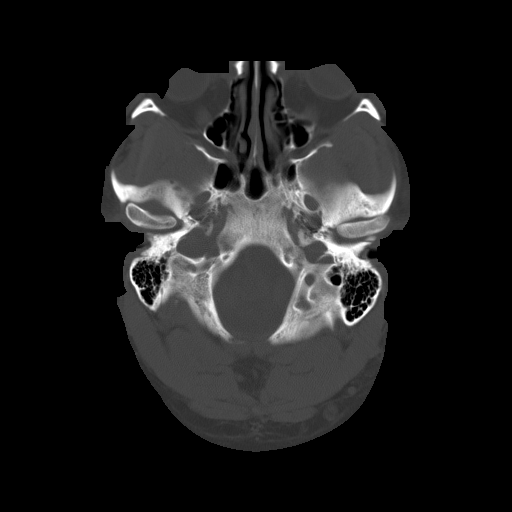
[im 7/32  bone]
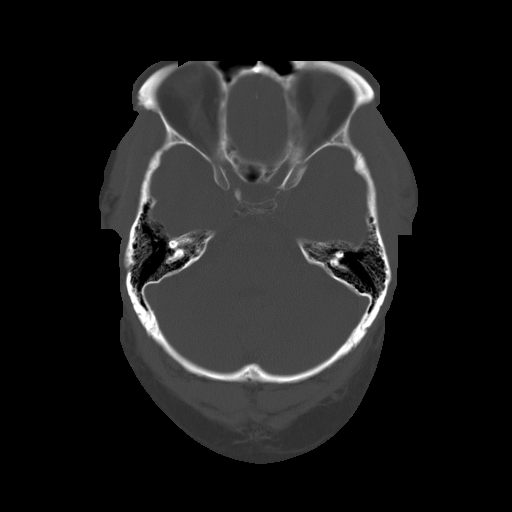
[im 12/32  bone]
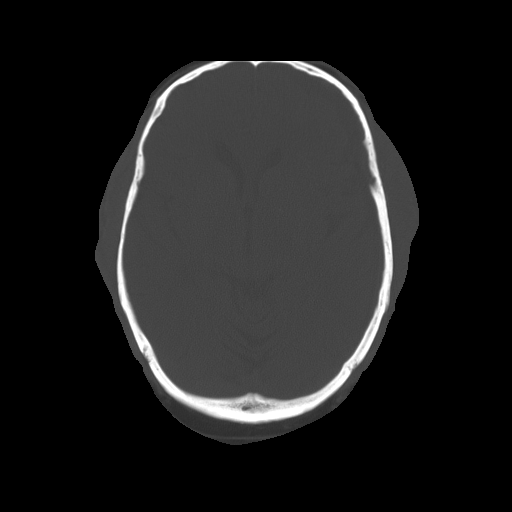

[Series 32: 3d filtered head w/o · axial · non-contrast · 0.49mm/px · z∈[+11,+144]mm · 13 of 32 slices shown, 17 images]
[im 3/32  brain]
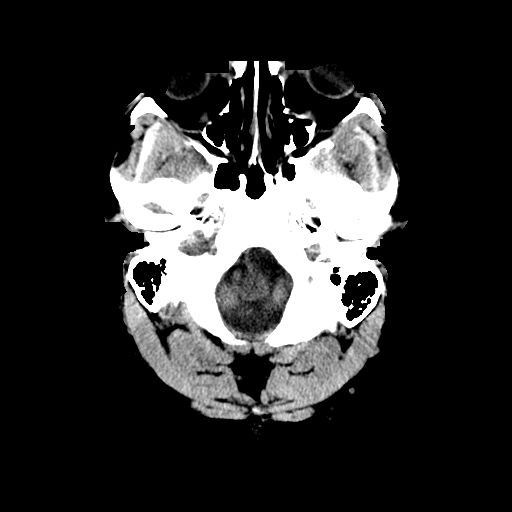
[im 3/32  bone]
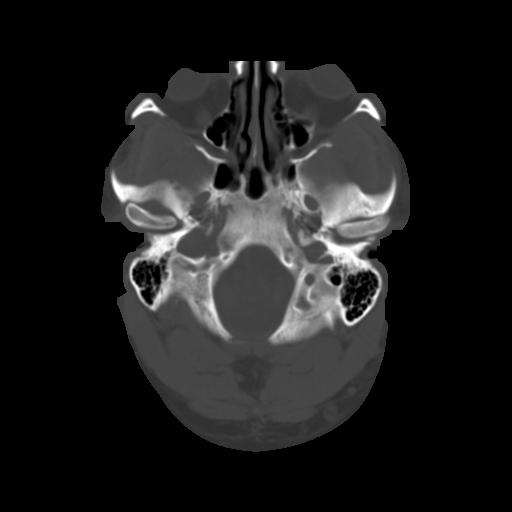
[im 5/32  brain]
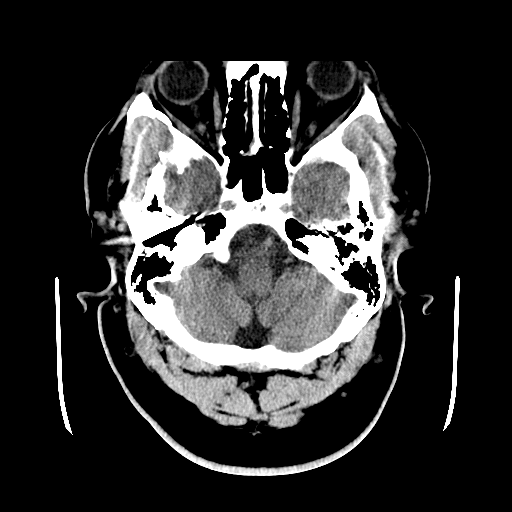
[im 7/32  brain]
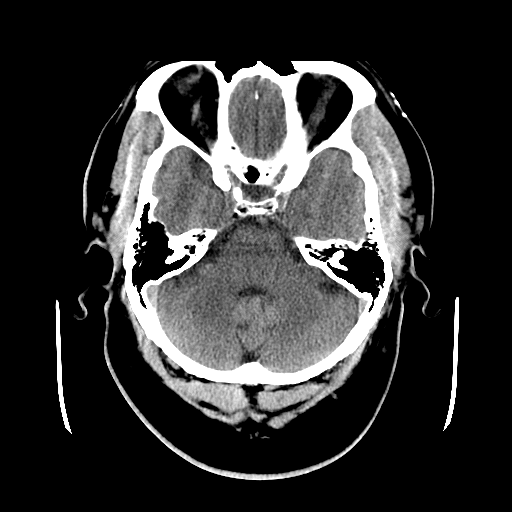
[im 9/32  brain]
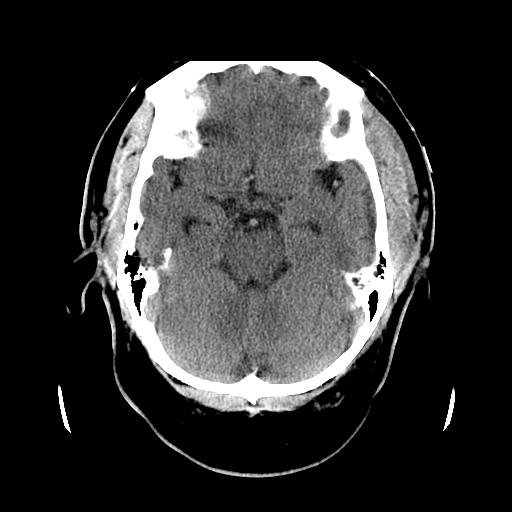
[im 12/32  brain]
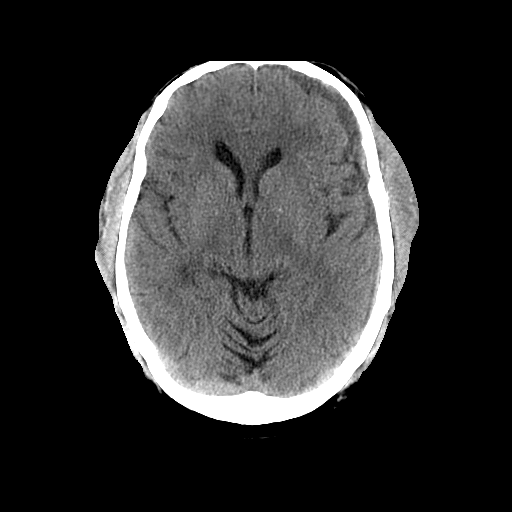
[im 12/32  bone]
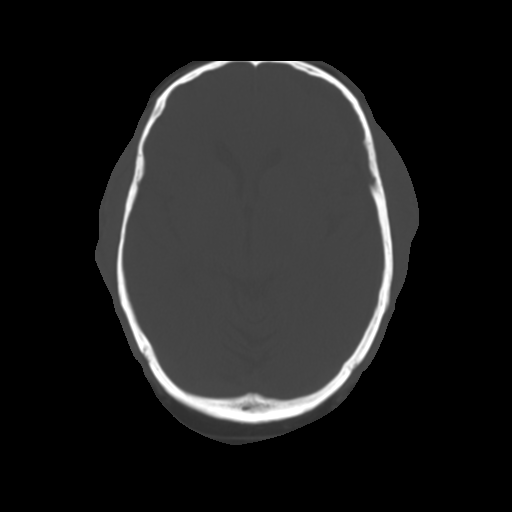
[im 14/32  brain]
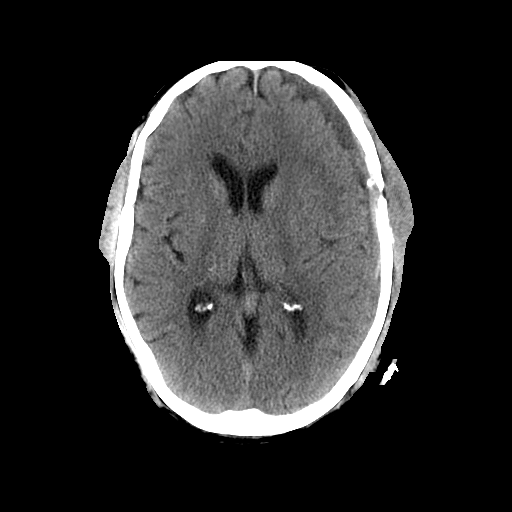
[im 16/32  brain]
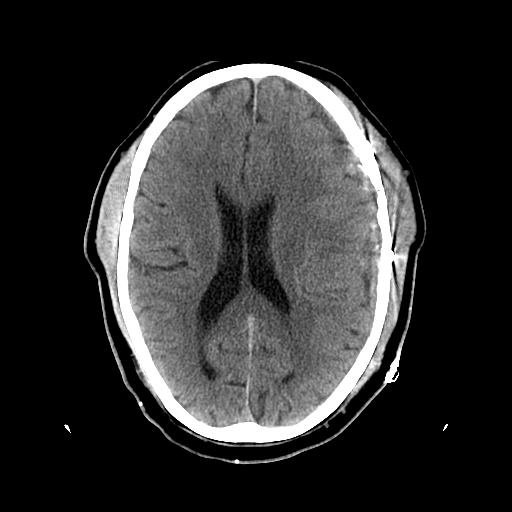
[im 18/32  brain]
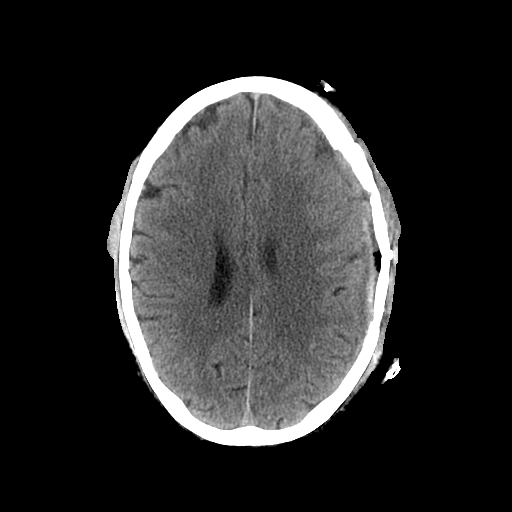
[im 20/32  brain]
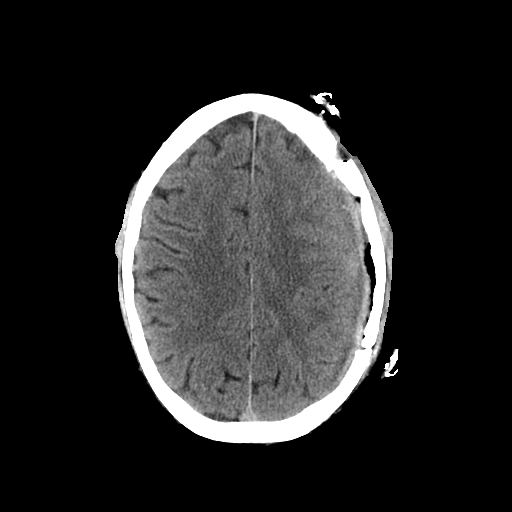
[im 20/32  bone]
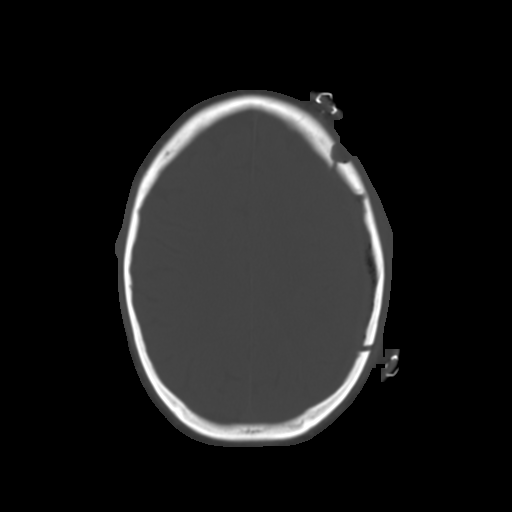
[im 23/32  brain]
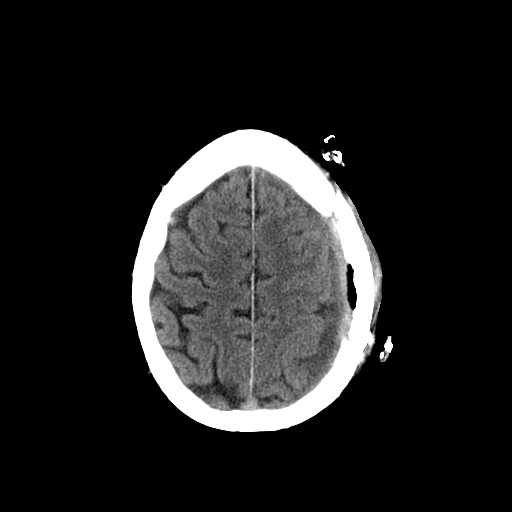
[im 25/32  brain]
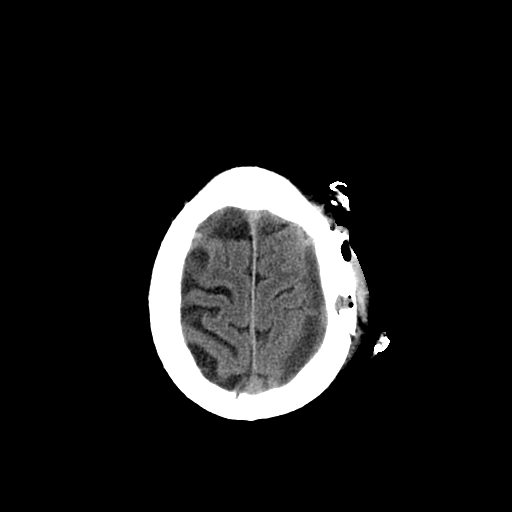
[im 27/32  brain]
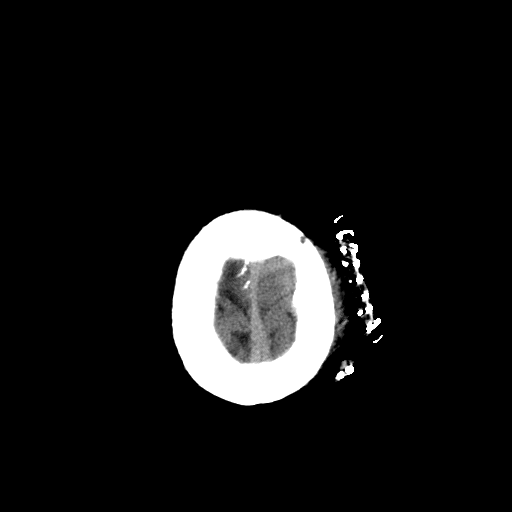
[im 29/32  brain]
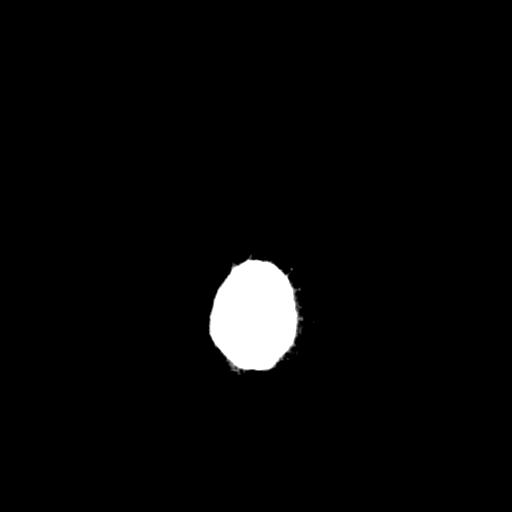
[im 29/32  bone]
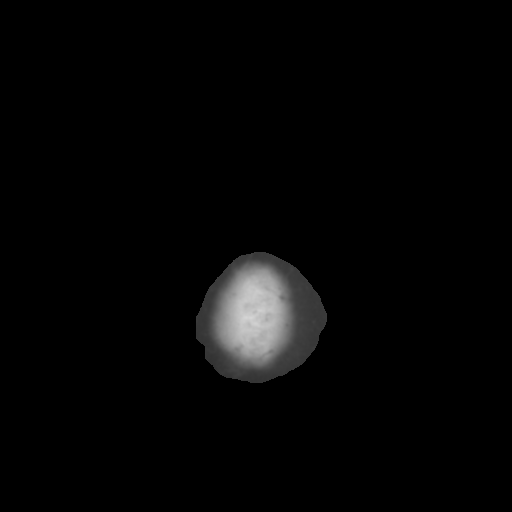

[16 of 30 positions shown; findings below may reference images not displayed]

FINDINGS: Left craniotomy for subdural drainage. Subdural drain has been
removed. Mixed density subdural fluid collection on the left is
primarily low density with areas of dural thickening . There is some
mild dural calcification. Fluid collection left frontal lobe
measures 7 mm. Left parietal fluid collection measures 8 mm. No
acute hemorrhage.

Mild midline shift to the right 3 mm has improved in the interval.
Negative for hydrocephalus

Negative for acute infarct or mass.
IMPRESSION: Left hemispheric chronic subdural hematoma is primarily low density
measuring 7 mm frontally and 8 mm parietal. Mild midline shift to
the right has improved. No new area of acute hemorrhage.

## 2017-02-19 ENCOUNTER — Ambulatory Visit (INDEPENDENT_AMBULATORY_CARE_PROVIDER_SITE_OTHER): Payer: Medicare Other | Admitting: Urology

## 2017-02-19 ENCOUNTER — Other Ambulatory Visit (HOSPITAL_COMMUNITY)
Admission: RE | Admit: 2017-02-19 | Discharge: 2017-02-19 | Disposition: A | Discharge status: Home / Self Care | Payer: Medicare Other | Source: Ambulatory Visit | Attending: Urology | Admitting: Urology

## 2017-02-19 DIAGNOSIS — R351 Nocturia: Secondary | ICD-10-CM

## 2017-02-19 DIAGNOSIS — R310 Gross hematuria: Secondary | ICD-10-CM | POA: Diagnosis not present

## 2017-02-19 DIAGNOSIS — R3121 Asymptomatic microscopic hematuria: Secondary | ICD-10-CM | POA: Insufficient documentation

## 2017-02-19 DIAGNOSIS — N401 Enlarged prostate with lower urinary tract symptoms: Secondary | ICD-10-CM

## 2017-02-19 LAB — URINALYSIS, ROUTINE W REFLEX MICROSCOPIC
BACTERIA UA: NONE SEEN
BILIRUBIN URINE: NEGATIVE
Glucose, UA: NEGATIVE mg/dL
Hgb urine dipstick: NEGATIVE
Ketones, ur: NEGATIVE mg/dL
Leukocytes, UA: NEGATIVE
Nitrite: NEGATIVE
Protein, ur: 30 mg/dL — AB
SPECIFIC GRAVITY, URINE: 1.017 (ref 1.005–1.030)
pH: 6 (ref 5.0–8.0)

## 2017-04-02 ENCOUNTER — Ambulatory Visit (INDEPENDENT_AMBULATORY_CARE_PROVIDER_SITE_OTHER): Payer: Medicare Other | Admitting: Urology

## 2017-04-02 DIAGNOSIS — R310 Gross hematuria: Secondary | ICD-10-CM | POA: Diagnosis not present

## 2017-04-02 DIAGNOSIS — R351 Nocturia: Secondary | ICD-10-CM | POA: Diagnosis not present

## 2017-04-02 DIAGNOSIS — N401 Enlarged prostate with lower urinary tract symptoms: Secondary | ICD-10-CM

## 2018-08-15 ENCOUNTER — Encounter (HOSPITAL_COMMUNITY): Payer: Self-pay | Admitting: *Deleted

## 2018-08-15 ENCOUNTER — Emergency Department (HOSPITAL_COMMUNITY): Payer: Medicare Other

## 2018-08-15 ENCOUNTER — Emergency Department (HOSPITAL_COMMUNITY)
Admission: EM | Admit: 2018-08-15 | Discharge: 2018-08-15 | Disposition: A | Discharge status: Home / Self Care | Payer: Medicare Other | Attending: Emergency Medicine | Admitting: Emergency Medicine

## 2018-08-15 DIAGNOSIS — M25562 Pain in left knee: Secondary | ICD-10-CM | POA: Insufficient documentation

## 2018-08-15 DIAGNOSIS — Z79899 Other long term (current) drug therapy: Secondary | ICD-10-CM | POA: Diagnosis not present

## 2018-08-15 MED ORDER — TRAMADOL HCL 50 MG PO TABS: 50.0000 mg | ORAL_TABLET | Freq: Four times a day (QID) | ORAL | 0 refills | Status: DC | PRN

## 2018-08-15 NOTE — ED Triage Notes (Addendum)
Pt left knee pain starting today since waking up, pt denies any injury to knee but has hx of replacement and cartilage.  Pt unable to bend knee,  Pt denies swelling as well.

## 2018-08-15 NOTE — ED Provider Notes (Signed)
[facility] EMERGENCY DEPARTMENT Provider Note   CSN: [id number] Arrival date & time: 08/15/18  1452     History   Chief Complaint Chief Complaint  Patient presents with  . Knee Pain    HPI Devin Callahan is a 73 y.o. male.  HPI   Devin Callahan is a 73 y.o. male who presents to the Emergency Department complaining of pain to his left knee upon waking this morning.  He describes pain to the medial aspect of the lower knee that is associated with movement and weightbearing.  He denies known injury.  He is status post total knee replacement in 2012.  He is tried heat and applying over-the-counter muscle cream without relief.  Pain does not radiate.  He denies redness, excessive warmth, calf pain or swelling, numbness or weakness of the extremity.   Past Medical History:  Diagnosis Date  . High blood pressure    Cholesterol    Patient Active Problem List   Diagnosis Date Noted  . S/P craniotomy 11/12/2014  . Subdural hematoma ([place]) 11/11/2014  . 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 Knee stiffness 01/08/2011  . 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 JOINT EFFUSION, KNEE 02/07/2010  . HIGH BLOOD PRESSURE 05/04/2008  . OSTEOARTHRITIS, LOWER LEG 05/21/2007    Past Surgical History:  Procedure Laterality Date  . BACK SURGERY  1999  . COLON SURGERY  2009  . CRANIOTOMY Left 11/12/2014   Procedure: LEFT SIDED CRANIOTOMY FOR EVACUATION OF SUBDURAL HEMATOMA;  Surgeon: Eustace Moore, MD;  Location: [facility];  Service: Neurosurgery;  Laterality: Left;  . FOOT SURGERY    . KNEE SURGERY  3.20.2012   knee replacement LEFT    . KNEE SURGERY     RIGHT knee replacement        Home Medications    Prior to Admission medications   Medication Sig Start Date End Date Taking? Authorizing Provider  albuterol (PROVENTIL HFA;VENTOLIN HFA) 108 (90 BASE) MCG/ACT inhaler Inhale 1-2 puffs into the lungs every 6 (six) hours as needed for wheezing or shortness of breath.    [provider]  allopurinol (ZYLOPRIM) 300 MG tablet Take 300 mg by mouth daily as needed (gout).  11/29/10   [provider]  amLODipine (NORVASC) 10 MG tablet Take 10 mg by mouth daily.  11/29/10   [provider]  CRESTOR 20 MG tablet Take 20 mg by mouth every evening.  11/22/10   [provider]  fexofenadine (ALLEGRA ALLERGY) 180 MG tablet Take 180 mg by mouth.      [provider]  hydrochlorothiazide 25 MG tablet Take 25 mg by mouth daily.  11/29/10   [provider]  HYDROcodone-acetaminophen (NORCO/VICODIN) 5-325 MG per tablet Take 1 tablet by mouth every 4 (four) hours as needed for moderate pain. 11/17/14   Eustace Moore, MD  KLOR-CON M20 20 MEQ tablet Take 20 mEq by mouth daily as needed (potassium).  10/18/10   [provider]  omeprazole (PRILOSEC) 20 MG capsule Take 20 mg by mouth daily.  12/25/10   [provider]    Family History History reviewed. No pertinent family history.  Social History Social History   Tobacco Use  . Smoking status: Never Smoker  . Smokeless tobacco: Never Used  Substance Use Topics  . Alcohol use: No  . Drug use: No     Allergies   Other and Shrimp [shellfish allergy]   Review of Systems Review of Systems  Constitutional: Negative for chills and fever.  Respiratory: Negative for shortness of breath.   Cardiovascular: Negative for chest pain.  Musculoskeletal: Positive for arthralgias (Left knee pain). Negative for back pain and joint swelling.  Skin: Negative for color change and wound.  Neurological: Negative for weakness and numbness.     Physical Exam Updated Vital Signs BP 136/77 (BP Location: Right Arm)   Pulse 83   Temp 98.4 F (36.9 C) (Oral)   Resp 18   Ht 6\' 2"  (1.88 m)   Wt (!) 137.9 kg   SpO2 96%   BMI 39.03 kg/m   Physical Exam Vitals signs and nursing note reviewed.  Constitutional:      General: He is not in acute distress.    Appearance: Normal appearance. He  is not ill-appearing or toxic-appearing.  Neck:     Musculoskeletal: Normal range of motion.  Cardiovascular:     Rate and Rhythm: Normal rate and regular rhythm.     Pulses: Normal pulses.  Pulmonary:     Effort: Pulmonary effort is normal.  Chest:     Chest wall: No tenderness.  Musculoskeletal:        General: Tenderness present. No swelling or signs of injury.     Comments: Mild tenderness to palpation of the anterior and medial aspect of the left knee.  No palpable effusion.  No excessive warmth or erythema.  Pain on range of motion.  Mild pain on valgus stress.  Skin:    General: Skin is warm.     Capillary Refill: Capillary refill takes less than 2 seconds.     Findings: No erythema or rash.  Neurological:     General: No focal deficit present.     Mental Status: He is alert.      ED Treatments / Results  Labs (all labs ordered are listed, but only abnormal results are displayed) Labs Reviewed - No data to display  EKG None  Radiology Dg Knee Complete 4 Views Left  Result Date: 08/15/2018 CLINICAL DATA:  Left knee pain EXAM: LEFT KNEE - COMPLETE 4+ VIEW COMPARISON:  02/08/2014 FINDINGS: Patient is status post tricompartmental knee replacement. Stable appearance without periprosthetic fracture. No worrisome lytic or sclerotic osseous abnormality. No joint effusion. IMPRESSION: Status post total knee replacement without complicating features. Electronically Signed   By: Misty Stanley M.D.   On: 08/15/2018 16:02    Procedures Procedures (including critical care time)  Medications Ordered in ED Medications - No data to display   Initial Impression / Assessment and Plan / ED Course  I have reviewed the triage vital signs and the nursing notes.  Pertinent labs & imaging results that were available during my care of the patient were reviewed by me and considered in my medical decision making (see chart for details).     Patient with likely sprain of the left knee.   He is neurovascularly intact.  No concerning symptoms for septic joint.  No palpable effusion of the knee.  He agrees to symptomatic treatment, RICE therapy and close orthopedic follow-up.  He appears appropriate for discharge home and return precautions were discussed.  Final Clinical Impressions(s) / ED Diagnoses   Final diagnoses:  Acute pain of left knee    ED Discharge Orders    None       Kem Parkinson, PA-C 08/15/18 1650    Milton Ferguson, MD 08/16/18 [phone]

## 2018-08-15 NOTE — Discharge Instructions (Addendum)
Apply ice packs on and off to your knee.  Wear the brace as needed for support, but do not wear continuously or at bedtime.  Call Dr. Ruthe Mannan office in 1 week if not improving.

## 2018-08-31 ENCOUNTER — Ambulatory Visit: Payer: Medicare Other | Admitting: Orthopedic Surgery

## 2018-08-31 ENCOUNTER — Encounter: Payer: Self-pay | Admitting: Orthopedic Surgery

## 2018-08-31 VITALS — BP 145/85 | HR 75 | Ht 75.0 in | Wt 306.0 lb

## 2018-08-31 DIAGNOSIS — Z96652 Presence of left artificial knee joint: Secondary | ICD-10-CM | POA: Diagnosis not present

## 2018-08-31 NOTE — Progress Notes (Signed)
NEW Problem // OFFICE VISIT  Chief Complaint  Patient presents with  . Knee Pain    history of left total knee replacement 2012 has had increased pain couple weeks    73 years old bilateral total knees the right one in 2012 required manipulation he rates his left knee as a B+ in the right knee as in a  On January 12 he woke up he swung his knee over the side of the bed and it locked up.  He had acute pain he can bend his knee although he can walk on it.  He went to the ER he had evaluation x-rays were negative he was given symptomatic treatment and he comes in today saying that his pain has now resolved the knee is bending like it did before  Pain located medial side left knee quality dull severity now mild initially severe duration lasted about 3 days context no trauma getting better   Review of Systems  Constitutional: Negative for fever.  Skin: Negative.      Past Medical History:  Diagnosis Date  . High blood pressure    Cholesterol    Past Surgical History:  Procedure Laterality Date  . BACK SURGERY  1999  . COLON SURGERY  2009  . CRANIOTOMY Left 11/12/2014   Procedure: LEFT SIDED CRANIOTOMY FOR EVACUATION OF SUBDURAL HEMATOMA;  Surgeon: Eustace Moore, MD;  Location: [place] [facility];  Service: Neurosurgery;  Laterality: Left;  . FOOT SURGERY    . JOINT REPLACEMENT Left 2012   left knee   . KNEE SURGERY  3.20.2012   knee replacement LEFT    . KNEE SURGERY     RIGHT knee replacement    Family History  Problem Relation Age of Onset  . Cancer Father   . CVA Sister    Social History   Tobacco Use  . Smoking status: Never Smoker  . Smokeless tobacco: Never Used  Substance Use Topics  . Alcohol use: No  . Drug use: No    Allergies  Allergen Reactions  . Other Nausea And Vomiting    olives  . Shrimp [Shellfish Allergy] Nausea And Vomiting    Current Meds  Medication Sig  . albuterol (PROVENTIL HFA;VENTOLIN HFA) 108 (90 BASE) MCG/ACT inhaler Inhale 1-2 puffs  into the lungs every 6 (six) hours as needed for wheezing or shortness of breath.  Marland Kitchen amLODipine (NORVASC) 10 MG tablet Take 10 mg by mouth daily.   . CRESTOR 20 MG tablet Take 20 mg by mouth every evening.   . fexofenadine (ALLEGRA ALLERGY) 180 MG tablet Take 180 mg by mouth.    . hydrochlorothiazide 25 MG tablet Take 25 mg by mouth daily.   Marland Kitchen lisinopril (PRINIVIL,ZESTRIL) 40 MG tablet   . omeprazole (PRILOSEC) 20 MG capsule Take 20 mg by mouth daily.     BP (!) 145/85   Pulse 75   Ht 6\' 3"  (1.905 m)   Wt (!) 306 lb (138.8 kg)   BMI 38.25 kg/m   Physical Exam Vitals signs reviewed.  Constitutional:      Appearance: Normal appearance. He is well-developed.  Musculoskeletal:     Right lower leg: 1+ Pitting Edema present.     Left lower leg: 1+ Pitting Edema present.  Skin:    General: Skin is warm and dry.     Findings: No erythema.  Neurological:     Mental Status: He is alert and oriented to person, place, and time.  Psychiatric:  Mood and Affect: Mood and affect normal.     Ortho Exam Right knee midline incision healed well no tenderness or swelling limb alignment all ligaments are stable knee flexion is 110 degrees muscle tone and strength are normal with no tremor sensation is intact  Left knee full extension flexion is 105 degrees no tenderness or swelling all ligaments are stable strength and muscle tone are normal there is no tremor sensation is intact   MEDICAL DECISION SECTION  Xrays were done at 4 views of the left knee were done at the hospital  I interpreted those x-rays as stable prosthesis without loosening   Encounter Diagnosis  Name Primary?  . S/P total knee replacement, left 2012 Yes    PLAN: (Rx., injectx, surgery, frx, mri/ct) OTC meds as needed  Local creams rubs   No orders of the defined types were placed in this encounter.   Arther Abbott, MD  08/31/2018 1:54 PM

## 2018-08-31 NOTE — Patient Instructions (Signed)
These are the muscle and arthrits creams I recommend for soreness   PLEASE READ THE PACKAGE INSTRUCTIONS BEFORE USING   Ben Gay arthritis cream  Icy hot vanishing gel  Aspercreme odor free  Myoflex Oderless pain reliever  Capzasin  Sportscreme  Max freeze

## 2018-11-16 ENCOUNTER — Emergency Department (HOSPITAL_COMMUNITY)
Admission: EM | Admit: 2018-11-16 | Discharge: 2018-11-16 | Disposition: A | Discharge status: Home / Self Care | Payer: Medicare Other | Attending: Emergency Medicine | Admitting: Emergency Medicine

## 2018-11-16 ENCOUNTER — Encounter (HOSPITAL_COMMUNITY): Payer: Self-pay | Admitting: Emergency Medicine

## 2018-11-16 ENCOUNTER — Other Ambulatory Visit: Payer: Self-pay

## 2018-11-16 DIAGNOSIS — M25531 Pain in right wrist: Secondary | ICD-10-CM | POA: Insufficient documentation

## 2018-11-16 DIAGNOSIS — Z96652 Presence of left artificial knee joint: Secondary | ICD-10-CM | POA: Insufficient documentation

## 2018-11-16 DIAGNOSIS — Z79899 Other long term (current) drug therapy: Secondary | ICD-10-CM | POA: Insufficient documentation

## 2018-11-16 DIAGNOSIS — I10 Essential (primary) hypertension: Secondary | ICD-10-CM | POA: Diagnosis not present

## 2018-11-16 HISTORY — DX: Gout, unspecified: M10.9

## 2018-11-16 MED ORDER — HYDROCODONE-ACETAMINOPHEN 5-325 MG PO TABS: 2.0000 | ORAL_TABLET | ORAL | 0 refills | Status: AC | PRN

## 2018-11-16 MED ORDER — PREDNISONE 20 MG PO TABS
40.0000 mg | ORAL_TABLET | Freq: Once | ORAL | Status: AC
  Administered 2018-11-16: 40 mg via ORAL
  Filled 2018-11-16: qty 2

## 2018-11-16 MED ORDER — PREDNISONE 10 MG PO TABS: 10.0000 mg | ORAL_TABLET | Freq: Every day | ORAL | 0 refills | Status: DC

## 2018-11-16 NOTE — Discharge Instructions (Addendum)
Take Prednisone as directed for the next week Take pain medicine as needed for severe pain Please follow up with your doctor

## 2018-11-16 NOTE — ED Provider Notes (Signed)
[facility] EMERGENCY DEPARTMENT Provider Note   CSN: [id number] Arrival date & time: 11/16/18  1529    History   Chief Complaint Chief Complaint  Patient presents with  . Wrist Pain    HPI Devin Callahan is a 73 y.o. male who presents with right wrist pain. PMh significant for HTN, gout, arthritis. He states that since Friday his right wrist has been "giving me a fit". He reports pain and swelling in the right wrist. It feels like a gout flare that he's had in the past. He had bladder surgery a month ago and since then he has had gout in multiple places in his left toe, right toe, left wrist. He was treated for gout by his orthopedic doctor and pain in these areas improved but now his right wrist is hurting him a lot and he can't wait for an appointment. He denies trauma to the area. No fevers. He denies significant meat or alcohol intake. He does take HCTZ. He was previously on Allopurinol but hasn't taken this in years.     HPI  Past Medical History:  Diagnosis Date  . Gout   . High blood pressure    Cholesterol    Patient Active Problem List   Diagnosis Date Noted  . S/P craniotomy 11/12/2014  . 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 Knee stiffness 01/08/2011  . S/P total knee replacement, left 11/27/2010  . JOINT EFFUSION, KNEE 02/07/2010  . HIGH BLOOD PRESSURE 05/04/2008  . OSTEOARTHRITIS, LOWER LEG 05/21/2007    Past Surgical History:  Procedure Laterality Date  . BACK SURGERY  1999  . COLON SURGERY  2009  . CRANIOTOMY Left 11/12/2014   Procedure: LEFT SIDED CRANIOTOMY FOR EVACUATION OF SUBDURAL HEMATOMA;  Surgeon: Eustace Moore, MD;  Location: [place] [facility];  Service: Neurosurgery;  Laterality: Left;  . FOOT SURGERY    . JOINT REPLACEMENT Left 2012   left knee   . KNEE SURGERY  3.20.2012   knee replacement LEFT    . KNEE SURGERY     RIGHT knee replacement        Home Medications    Prior to Admission medications   Medication Sig Start Date End Date Taking? Authorizing Provider  albuterol (PROVENTIL HFA;VENTOLIN HFA) 108 (90 BASE) MCG/ACT inhaler Inhale 1-2 puffs into the lungs every 6 (six) hours as needed for wheezing or shortness of breath.    [provider]  allopurinol (ZYLOPRIM) 300 MG tablet Take 300 mg by mouth daily as needed (gout).  11/29/10   [provider]  amLODipine (NORVASC) 10 MG tablet Take 10 mg by mouth daily.  11/29/10   [provider]  CRESTOR 20 MG tablet Take 20 mg by mouth every evening.  11/22/10   [provider]  fexofenadine (ALLEGRA ALLERGY) 180 MG tablet Take 180 mg by mouth.      [provider]  hydrochlorothiazide 25 MG tablet Take 25 mg by mouth daily.  11/29/10   [provider]  KLOR-CON M20 20 MEQ tablet Take 20 mEq by mouth daily as needed (potassium).  10/18/10   [provider]  lisinopril (PRINIVIL,ZESTRIL) 40 MG tablet  08/22/18   [provider]  omeprazole (PRILOSEC) 20 MG capsule Take 20 mg by mouth daily.  12/25/10   [provider]    Family History Family History  Problem Relation Age of Onset  . Cancer Father   .  CVA Sister     Social History Social History   Tobacco Use  . Smoking status: Never Smoker  . Smokeless tobacco: Never Used  Substance Use Topics  . Alcohol use: No  . Drug use: No     Allergies   Other and Shrimp [shellfish allergy]   Review of Systems Review of Systems  Constitutional: Negative for fever.  Musculoskeletal: Positive for arthralgias and joint swelling.     Physical Exam Updated Vital Signs BP (!) 126/57 (BP Location: Right Arm)   Pulse 93   Temp 98.1 F (36.7 C) (Oral)   Resp 14   Ht 6\' 3"  (1.905 m)   Wt 134.3 kg   SpO2 98%   BMI 37.00 kg/m   Physical Exam Vitals signs and nursing note reviewed.  Constitutional:      General: He is not in acute distress.    Appearance: He is  well-developed. He is obese. He is not ill-appearing.  HENT:     Head: Normocephalic and atraumatic.  Eyes:     General: No scleral icterus.       Right eye: No discharge.        Left eye: No discharge.     Conjunctiva/sclera: Conjunctivae normal.     Pupils: Pupils are equal, round, and reactive to light.  Neck:     Musculoskeletal: Normal range of motion.  Cardiovascular:     Rate and Rhythm: Normal rate.  Pulmonary:     Effort: Pulmonary effort is normal. No respiratory distress.  Abdominal:     General: There is no distension.  Musculoskeletal:     Comments: Right wrist: Moderate swelling along the lateral aspect of the wrist with associated tenderness. No redness or wounds. Decreased ROM of wrist. 2+ radial pulse.  Skin:    General: Skin is warm and dry.  Neurological:     Mental Status: He is alert and oriented to person, place, and time.  Psychiatric:        Behavior: Behavior normal.      ED Treatments / Results  Labs (all labs ordered are listed, but only abnormal results are displayed) Labs Reviewed - No data to display  EKG None  Radiology No results found.  Procedures Procedures (including critical care time)  Medications Ordered in ED Medications - No data to display   Initial Impression / Assessment and Plan / ED Course  I have reviewed the triage vital signs and the nursing notes.  Pertinent labs & imaging results that were available during my care of the patient were reviewed by me and considered in my medical decision making (see chart for details).  73 year old male presents with right wrist pain and swelling for the past 3 days. It feels like a gout flare to him. There has not been any trauma therefore imaging is deferred today. His vitals are normal. Will treat for gout flare with steroid taper and pain medicine. He was instructed to f/u with his PCP.  Final Clinical Impressions(s) / ED Diagnoses   Final diagnoses:  None    ED Discharge  Orders    None       Recardo Evangelist, PA-C 11/16/18 1709    Davonna Belling, MD 11/16/18 2224

## 2018-11-16 NOTE — ED Triage Notes (Signed)
Pt states that he has pain in his right wrist it started Friday

## 2019-07-07 ENCOUNTER — Ambulatory Visit (INDEPENDENT_AMBULATORY_CARE_PROVIDER_SITE_OTHER): Payer: Medicare Other | Admitting: Orthopedic Surgery

## 2019-07-07 ENCOUNTER — Other Ambulatory Visit: Payer: Self-pay

## 2019-07-07 ENCOUNTER — Encounter (INDEPENDENT_AMBULATORY_CARE_PROVIDER_SITE_OTHER): Payer: Self-pay

## 2019-07-07 VITALS — BP 145/85 | HR 75 | Temp 97.0°F | Ht 74.0 in | Wt 306.2 lb

## 2019-07-07 DIAGNOSIS — G5601 Carpal tunnel syndrome, right upper limb: Secondary | ICD-10-CM | POA: Diagnosis not present

## 2019-07-07 MED ORDER — GABAPENTIN 100 MG PO CAPS: 100.0000 mg | ORAL_CAPSULE | Freq: Three times a day (TID) | ORAL | 2 refills | Status: AC

## 2019-07-07 NOTE — Progress Notes (Signed)
Devin Callahan  07/07/2019  Body mass index is 39.31 kg/m.   HISTORY SECTION :  Chief Complaint  Patient presents with  . Hand Pain    Numbness in right fingers   HPI The patient presents for evaluation of  (mild/moderate/severe/ ) mild to moderate pain, in the (right /left) right hand, for several weeks, associated with numbness and tingling.  Prior treatment no prior treatment   Review of Systems  Constitutional: Negative for chills and fever.  Musculoskeletal: Positive for back pain and joint pain.  Neurological: Positive for tingling, sensory change and focal weakness.     has a past medical history of Gout and High blood pressure.   Past Surgical History:  Procedure Laterality Date  . BACK SURGERY  1999  . COLON SURGERY  2009  . CRANIOTOMY Left 11/12/2014   Procedure: LEFT SIDED CRANIOTOMY FOR EVACUATION OF SUBDURAL HEMATOMA;  Surgeon: Eustace Moore, MD;  Location: [place] [facility];  Service: Neurosurgery;  Laterality: Left;  . FOOT SURGERY    . JOINT REPLACEMENT Left 2012   left knee   . KNEE SURGERY  3.20.2012   knee replacement LEFT    . KNEE SURGERY     RIGHT knee replacement    Body mass index is 39.31 kg/m.   Allergies  Allergen Reactions  . Other Nausea And Vomiting    olives  . Shrimp [Shellfish Allergy] Nausea And Vomiting     Current Outpatient Medications:  .  albuterol (PROVENTIL HFA;VENTOLIN HFA) 108 (90 BASE) MCG/ACT inhaler, Inhale 1-2 puffs into the lungs every 6 (six) hours as needed for wheezing or shortness of breath., Disp: , Rfl:  .  allopurinol (ZYLOPRIM) 300 MG tablet, Take 300 mg by mouth daily as needed (gout). , Disp: , Rfl:  .  DULoxetine (CYMBALTA) 60 MG capsule, Take 60 mg by mouth daily., Disp: , Rfl:  .  fexofenadine (ALLEGRA ALLERGY) 180 MG tablet, Take 180 mg by mouth.  , Disp: , Rfl:  .  hydrochlorothiazide 25 MG tablet, Take 25 mg by mouth daily. , Disp: , Rfl:  .  lisinopril (PRINIVIL,ZESTRIL) 40 MG tablet, , Disp: , Rfl:   .  omeprazole (PRILOSEC) 20 MG capsule, Take 20 mg by mouth daily. , Disp: , Rfl:  .  prazosin (MINIPRESS) 2 MG capsule, Take 2 mg by mouth at bedtime., Disp: , Rfl:  .  amLODipine (NORVASC) 10 MG tablet, Take 10 mg by mouth daily. , Disp: , Rfl:  .  CRESTOR 20 MG tablet, Take 20 mg by mouth every evening. , Disp: , Rfl:  .  gabapentin (NEURONTIN) 100 MG capsule, Take 1 capsule (100 mg total) by mouth 3 (three) times daily., Disp: 90 capsule, Rfl: 2 .  HYDROcodone-acetaminophen (NORCO/VICODIN) 5-325 MG tablet, Take 2 tablets by mouth every 4 (four) hours as needed. (Patient not taking: Reported on 07/07/2019), Disp: 10 tablet, Rfl: 0 .  KLOR-CON M20 20 MEQ tablet, Take 20 mEq by mouth daily as needed (potassium). , Disp: , Rfl:  .  predniSONE (DELTASONE) 10 MG tablet, Take 1 tablet (10 mg total) by mouth daily. Take 4 tabs by mouth daily for 3 days, then 3 tabs for 2 days, then 2 tabs for 2 days, then 1 tabs for 1 day (Patient not taking: Reported on 07/07/2019), Disp: 23 tablet, Rfl: 0   PHYSICAL EXAM SECTION: 1) BP (!) 145/85   Pulse 75   Temp (!) 97 F (36.1 C)   Ht 6\' 2"  (1.88  m)   Wt (!) 306 lb 3.2 oz (138.9 kg)   BMI 39.31 kg/m   Body mass index is 39.31 kg/m. General appearance: Well-developed well-nourished no gross deformities  2) Cardiovascular normal pulse and perfusion in the right and left extremities normal color without edema  3) Neurologically deep tendon reflexes are equal and normal, decreased sensation right hand index and long finger left hand normal 4) Psychological: Awake alert and oriented x3 mood and affect normal  5) Skin no lacerations or ulcerations no nodularity no palpable masses, no erythema or nodularity  6) Musculoskeletal: Right hand and wrist tenderness over the carpal tunnel positive compression test positive Tinel's and Phalen's test.  No atrophy is noted in the hand full range of motion with weak grip color is normal   MEDICAL DECISION SECTION:   Encounter Diagnosis  Name Primary?  . Carpal tunnel syndrome of right wrist Yes    Imaging NONE  Plan:  (Rx., Inj., surg., Frx, MRI/CT, XR:2)  Recommend splinting and gabapentin  Meds ordered this encounter  Medications  . gabapentin (NEURONTIN) 100 MG capsule    Sig: Take 1 capsule (100 mg total) by mouth 3 (three) times daily.    Dispense:  90 capsule    Refill:  2   Follow-up in a month  5:19 PM Arther Abbott, MD  07/07/2019

## 2019-07-07 NOTE — Patient Instructions (Addendum)
Wear brace at night for 2 weeks if no change then wear all the time  Start gabapentin 100 mg tid   Carpal Tunnel Syndrome  Carpal tunnel syndrome is a condition that causes pain in your hand and arm. The carpal tunnel is a narrow area that is on the palm side of your wrist. Repeated wrist motion or certain diseases may cause swelling in the tunnel. This swelling can pinch the main nerve in the wrist (median nerve). What are the causes? This condition may be caused by:  Repeated wrist motions.  Wrist injuries.  Arthritis.  A sac of fluid (cyst) or abnormal growth (tumor) in the carpal tunnel.  Fluid buildup during pregnancy. Sometimes the cause is not known. What increases the risk? The following factors may make you more likely to develop this condition:  Having a job in which you move your wrist in the same way many times. This includes jobs like being a Software engineer or a Scientist, water quality.  Being a woman.  Having other health conditions, such as: ? Diabetes. ? Obesity. ? A thyroid gland that is not active enough (hypothyroidism). ? Kidney failure. What are the signs or symptoms? Symptoms of this condition include:  A tingling feeling in your fingers.  Tingling or a loss of feeling (numbness) in your hand.  Pain in your entire arm. This pain may get worse when you bend your wrist and elbow for a long time.  Pain in your wrist that goes up your arm to your shoulder.  Pain that goes down into your palm or fingers.  A weak feeling in your hands. You may find it hard to grab and hold items. You may feel worse at night. How is this diagnosed? This condition is diagnosed with a medical history and physical exam. You may also have tests, such as:  Electromyogram (EMG). This test checks the signals that the nerves send to the muscles.  Nerve conduction study. This test checks how well signals pass through your nerves.  Imaging tests, such as X-rays, ultrasound, and MRI. These tests  check for what might be the cause of your condition. How is this treated? This condition may be treated with:  Lifestyle changes. You will be asked to stop or change the activity that caused your problem.  Doing exercise and activities that make bones and muscles stronger (physical therapy).  Learning how to use your hand again (occupational therapy).  Medicines for pain and swelling (inflammation). You may have injections in your wrist.  A wrist splint.  Surgery. Follow these instructions at home: If you have a splint:  Wear the splint as told by your doctor. Remove it only as told by your doctor.  Loosen the splint if your fingers: ? Tingle. ? Lose feeling (become numb). ? Turn cold and blue.  Keep the splint clean.  If the splint is not waterproof: ? Do not let it get wet. ? Cover it with a watertight covering when you take a bath or a shower. Managing pain, stiffness, and swelling   If told, put ice on the painful area: ? If you have a removable splint, remove it as told by your doctor. ? Put ice in a plastic bag. ? Place a towel between your skin and the bag. ? Leave the ice on for 20 minutes, 2-3 times per day. General instructions  Take over-the-counter and prescription medicines only as told by your doctor.  Rest your wrist from any activity that may cause pain. If needed,  talk with your boss at work about changes that can help your wrist heal.  Do any exercises as told by your doctor, physical therapist, or occupational therapist.  Keep all follow-up visits as told by your doctor. This is important. Contact a doctor if:  You have new symptoms.  Medicine does not help your pain.  Your symptoms get worse. Get help right away if:  You have very bad numbness or tingling in your wrist or hand. Summary  Carpal tunnel syndrome is a condition that causes pain in your hand and arm.  It is often caused by repeated wrist motions.  Lifestyle changes and  medicines are used to treat this problem. Surgery may help in very bad cases.  Follow your doctor's instructions about wearing a splint, resting your wrist, keeping follow-up visits, and calling for help. This information is not intended to replace advice given to you by your health care provider. Make sure you discuss any questions you have with your health care provider. Document Released: 07/11/2011 Document Revised: 11/28/2017 Document Reviewed: 11/28/2017 Elsevier Patient Education  2020 [employer].

## 2019-08-18 ENCOUNTER — Other Ambulatory Visit: Payer: Self-pay

## 2019-08-18 ENCOUNTER — Ambulatory Visit: Payer: Medicare Other | Admitting: Orthopedic Surgery

## 2019-08-18 ENCOUNTER — Encounter: Payer: Self-pay | Admitting: Orthopedic Surgery

## 2019-08-18 VITALS — BP 157/79 | HR 81 | Ht 74.0 in | Wt 304.0 lb

## 2019-08-18 DIAGNOSIS — G5601 Carpal tunnel syndrome, right upper limb: Secondary | ICD-10-CM

## 2019-08-18 NOTE — Patient Instructions (Addendum)
Continue wearing the brace and taking the gabapentin  If things get worse please call us back for reevaluation

## 2019-08-18 NOTE — Progress Notes (Signed)
Follow-up for carpal tunnel syndrome right wrist patient currently in splint and taking gabapentin.  He has constant numbness in his long finger but the other carpal tunnel related fingers will go numb at times  He does complain of some soreness in his shoulder where he had rotator cuff repair but that seems to be minor  Good grip strength is noted no atrophy is noted  Recommend continue gabapentin continue splinting and follow-up with [place] on an as-needed basis no surgery needed at this time  Encounter Diagnosis  Name Primary?  . Carpal tunnel syndrome of right wrist Yes   1 chronic illness not stable, by default progression/exacerbation  Prescription management

## 2023-08-19 ENCOUNTER — Other Ambulatory Visit (HOSPITAL_COMMUNITY): Payer: Self-pay | Admitting: Student

## 2023-08-19 DIAGNOSIS — Z0189 Encounter for other specified special examinations: Secondary | ICD-10-CM

## 2023-10-02 ENCOUNTER — Ambulatory Visit (HOSPITAL_COMMUNITY)
Admission: RE | Admit: 2023-10-02 | Discharge: 2023-10-02 | Disposition: A | Discharge status: Home / Self Care | Payer: No Typology Code available for payment source | Source: Ambulatory Visit | Attending: Student | Admitting: Student

## 2023-10-02 DIAGNOSIS — Z0189 Encounter for other specified special examinations: Secondary | ICD-10-CM | POA: Insufficient documentation

## 2023-10-02 MED ORDER — IOHEXOL 300 MG/ML  SOLN
125.0000 mL | Freq: Once | INTRAMUSCULAR | Status: AC | PRN
  Administered 2023-10-02: 125 mL via INTRAVENOUS
